# Patient Record
Sex: Female | Born: 1970 | Race: Black or African American | Hispanic: No | Marital: Married | Smoking: Never smoker
Health system: Southern US, Community
[De-identification: ages and names within clinical notes are randomized; demographics above are authoritative.]

## PROBLEM LIST (undated history)

## (undated) DIAGNOSIS — R011 Cardiac murmur, unspecified: Secondary | ICD-10-CM

## (undated) DIAGNOSIS — I8390 Asymptomatic varicose veins of unspecified lower extremity: Secondary | ICD-10-CM

## (undated) DIAGNOSIS — Z8619 Personal history of other infectious and parasitic diseases: Secondary | ICD-10-CM

## (undated) DIAGNOSIS — R768 Other specified abnormal immunological findings in serum: Secondary | ICD-10-CM

## (undated) DIAGNOSIS — E739 Lactose intolerance, unspecified: Secondary | ICD-10-CM

## (undated) DIAGNOSIS — F32A Depression, unspecified: Secondary | ICD-10-CM

## (undated) DIAGNOSIS — K219 Gastro-esophageal reflux disease without esophagitis: Secondary | ICD-10-CM

## (undated) DIAGNOSIS — E559 Vitamin D deficiency, unspecified: Secondary | ICD-10-CM

## (undated) HISTORY — PX: UMBILICAL HERNIA REPAIR: SHX196

## (undated) HISTORY — DX: Personal history of other infectious and parasitic diseases: Z86.19

## (undated) HISTORY — DX: Gastro-esophageal reflux disease without esophagitis: K21.9

## (undated) HISTORY — DX: Depression, unspecified: F32.A

## (undated) HISTORY — DX: Other specified abnormal immunological findings in serum: R76.8

## (undated) HISTORY — DX: Lactose intolerance, unspecified: E73.9

## (undated) HISTORY — DX: Vitamin D deficiency, unspecified: E55.9

## (undated) HISTORY — DX: Asymptomatic varicose veins of unspecified lower extremity: I83.90

## (undated) HISTORY — DX: Cardiac murmur, unspecified: R01.1

---

## 1993-04-09 DIAGNOSIS — Z8619 Personal history of other infectious and parasitic diseases: Secondary | ICD-10-CM

## 1993-04-09 HISTORY — DX: Personal history of other infectious and parasitic diseases: Z86.19

## 2005-01-15 ENCOUNTER — Other Ambulatory Visit: Admission: RE | Admit: 2005-01-15 | Discharge: 2005-01-15 | Payer: Self-pay | Admitting: Family Medicine

## 2006-02-04 ENCOUNTER — Other Ambulatory Visit: Admission: RE | Admit: 2006-02-04 | Discharge: 2006-02-04 | Payer: Self-pay | Admitting: Family Medicine

## 2007-02-13 ENCOUNTER — Other Ambulatory Visit: Admission: RE | Admit: 2007-02-13 | Discharge: 2007-02-13 | Payer: Self-pay | Admitting: Family Medicine

## 2009-01-27 ENCOUNTER — Other Ambulatory Visit: Admission: RE | Admit: 2009-01-27 | Discharge: 2009-01-27 | Payer: Self-pay | Admitting: Family Medicine

## 2009-08-26 ENCOUNTER — Emergency Department (HOSPITAL_COMMUNITY): Admission: EM | Admit: 2009-08-26 | Discharge: 2009-08-26 | Payer: Self-pay | Admitting: Family Medicine

## 2010-02-07 ENCOUNTER — Other Ambulatory Visit: Admission: RE | Admit: 2010-02-07 | Discharge: 2010-02-07 | Payer: Self-pay | Admitting: Family Medicine

## 2010-04-30 ENCOUNTER — Encounter: Payer: Self-pay | Admitting: Family Medicine

## 2010-05-11 ENCOUNTER — Other Ambulatory Visit: Payer: Self-pay | Admitting: Family Medicine

## 2010-05-11 DIAGNOSIS — N6320 Unspecified lump in the left breast, unspecified quadrant: Secondary | ICD-10-CM

## 2010-05-17 ENCOUNTER — Other Ambulatory Visit: Payer: Self-pay

## 2010-05-31 ENCOUNTER — Ambulatory Visit
Admission: RE | Admit: 2010-05-31 | Discharge: 2010-05-31 | Disposition: A | Discharge status: Home / Self Care | Payer: Commercial Managed Care - PPO | Source: Ambulatory Visit | Attending: Family Medicine | Admitting: Family Medicine

## 2010-05-31 DIAGNOSIS — N6320 Unspecified lump in the left breast, unspecified quadrant: Secondary | ICD-10-CM

## 2010-06-05 ENCOUNTER — Emergency Department (HOSPITAL_COMMUNITY)
Admission: EM | Admit: 2010-06-05 | Discharge: 2010-06-05 | Disposition: A | Discharge status: Home / Self Care | Payer: 59 | Attending: Emergency Medicine | Admitting: Emergency Medicine

## 2010-06-05 ENCOUNTER — Emergency Department (HOSPITAL_COMMUNITY): Payer: 59

## 2010-06-05 DIAGNOSIS — S83006A Unspecified dislocation of unspecified patella, initial encounter: Secondary | ICD-10-CM | POA: Insufficient documentation

## 2010-06-05 DIAGNOSIS — X58XXXA Exposure to other specified factors, initial encounter: Secondary | ICD-10-CM | POA: Insufficient documentation

## 2011-05-08 ENCOUNTER — Other Ambulatory Visit: Payer: Self-pay | Admitting: Family Medicine

## 2011-05-08 DIAGNOSIS — N63 Unspecified lump in unspecified breast: Secondary | ICD-10-CM

## 2011-05-08 DIAGNOSIS — Z1231 Encounter for screening mammogram for malignant neoplasm of breast: Secondary | ICD-10-CM

## 2011-05-30 ENCOUNTER — Other Ambulatory Visit: Payer: Self-pay

## 2011-05-30 ENCOUNTER — Other Ambulatory Visit: Payer: Self-pay | Admitting: Family Medicine

## 2011-05-30 ENCOUNTER — Other Ambulatory Visit (HOSPITAL_COMMUNITY)
Admission: RE | Admit: 2011-05-30 | Discharge: 2011-05-30 | Disposition: A | Discharge status: Home / Self Care | Payer: 59 | Source: Ambulatory Visit | Attending: Family Medicine | Admitting: Family Medicine

## 2011-05-30 DIAGNOSIS — Z01419 Encounter for gynecological examination (general) (routine) without abnormal findings: Secondary | ICD-10-CM | POA: Insufficient documentation

## 2011-05-30 DIAGNOSIS — N63 Unspecified lump in unspecified breast: Secondary | ICD-10-CM

## 2011-06-06 ENCOUNTER — Ambulatory Visit
Admission: RE | Admit: 2011-06-06 | Discharge: 2011-06-06 | Disposition: A | Discharge status: Home / Self Care | Payer: 59 | Source: Ambulatory Visit | Attending: Family Medicine | Admitting: Family Medicine

## 2011-06-06 DIAGNOSIS — N63 Unspecified lump in unspecified breast: Secondary | ICD-10-CM

## 2013-02-20 ENCOUNTER — Emergency Department (INDEPENDENT_AMBULATORY_CARE_PROVIDER_SITE_OTHER)
Admission: EM | Admit: 2013-02-20 | Discharge: 2013-02-20 | Disposition: A | Discharge status: Home / Self Care | Payer: 59 | Source: Home / Self Care | Attending: Emergency Medicine | Admitting: Emergency Medicine

## 2013-02-20 ENCOUNTER — Encounter (HOSPITAL_COMMUNITY): Payer: Self-pay | Admitting: Emergency Medicine

## 2013-02-20 DIAGNOSIS — T50905A Adverse effect of unspecified drugs, medicaments and biological substances, initial encounter: Secondary | ICD-10-CM

## 2013-02-20 DIAGNOSIS — T3995XA Adverse effect of unspecified nonopioid analgesic, antipyretic and antirheumatic, initial encounter: Secondary | ICD-10-CM

## 2013-02-20 DIAGNOSIS — T887XXA Unspecified adverse effect of drug or medicament, initial encounter: Secondary | ICD-10-CM

## 2013-02-20 MED ORDER — DIPHENHYDRAMINE HCL 25 MG PO CAPS
ORAL_CAPSULE | ORAL | Status: AC
  Filled 2013-02-20: qty 2

## 2013-02-20 MED ORDER — DIPHENHYDRAMINE HCL 25 MG PO CAPS
50.0000 mg | ORAL_CAPSULE | Freq: Once | ORAL | Status: AC
  Administered 2013-02-20: 50 mg via ORAL

## 2013-02-20 MED ORDER — HYDROXYZINE HCL 25 MG PO TABS: 25.0000 mg | ORAL_TABLET | Freq: Four times a day (QID) | ORAL | Status: DC

## 2013-02-20 NOTE — ED Provider Notes (Addendum)
Chief Complaint:   Chief Complaint  Patient presents with  . Allergic Reaction    History of Present Illness:   Natasha Murphy is a 42 year old female who comes in today, thinking that she has had some type of a reaction to tramadol and the patient has had a one-week history of pain in her neck which radiates along her left trapezius region of her left shoulder. She saw a chiropractor for this twice. He gave her some chiropractic adjustments and prescribed Robaxin. She took this for a couple days but didn't feel any better. Last night she went to the [facility] walk in clinic and she was given a prescription for tramadol. She took this around 8 AM today and shortly thereafter began to experience symptoms of extreme drowsiness, fatigue, dizziness, faintness, nausea, felt like she had difficulty breathing, noted coughing, generalized itching, and racing of her heart. She denies any rash, hives, wheezing, or swelling of her lips, tongue, or throat. She's never had a reaction like this before, but she does not think she is taking tramadol before.  Review of Systems:  Other than noted above, the patient denies any of the following symptoms. Systemic:  No fever, chills, sweats, fatigue, myalgias, headache, or anorexia. Eye:  No redness, pain or drainage. ENT:  No earache, nasal congestion, rhinorrhea, sinus pressure, or sore throat. Lungs:  No cough, sputum production, wheezing, shortness of breath.  Cardiovascular:  No chest pain, palpitations, or syncope. GI:  No nausea, vomiting, abdominal pain or diarrhea. GU:  No dysuria, frequency, or hematuria. Skin:  No rash or pruritis.  PMFSH:  Past medical history, family history, social history, meds, and allergies were reviewed.  She is allergic to penicillin.  Physical Exam:   Vital signs:  BP 125/89  Pulse 99  Temp(Src) 98 F (36.7 C) (Oral)  Resp 18  SpO2 100%  LMP 02/07/2013 Filed Vitals:   02/20/13 1827 02/20/13 1840 Supine  02/20/13  1842 Sitting  02/20/13 1842 Standing   BP: 121/83 135/86 131/82 125/89  Pulse: 103 101 92 99  Temp: 98 F (36.7 C)     TempSrc: Oral     Resp: 18     SpO2: 100%      General:  Alert, the patient is tearful, and appears nervous and anxious. There is no respiratory distress. Eye:  PERRL, full EOMs.  Lids and conjunctivas were normal. ENT:  TMs and canals were normal, without erythema or inflammation.  Nasal mucosa was clear and uncongested, without drainage.  Mucous membranes were moist.  Pharynx was clear, without exudate or drainage.  There were no oral ulcerations or lesions. Neck:  Supple, no adenopathy, tenderness or mass. Thyroid was normal. Lungs:  No respiratory distress.  Lungs were clear to auscultation, without wheezes, rales or rhonchi.  Breath sounds were clear and equal bilaterally. Heart:  Regular rhythm, without gallops, murmers or rubs. Abdomen:  Soft, flat, and non-tender to palpation.  No hepatosplenomagaly or mass. Skin:  Clear, warm, and dry, without rash or lesions.  Course in Urgent Care Center:   She was given Benadryl 50 mg by mouth. Thereafter she felt drowsy, but did not feel any worse and did not have any respiratory distress. She was observed for approximately 45 minutes at the Urgent Care Center, and since she was not having any further episodes of anaphylaxis, urticaria, respiratory distress, was allowed to go home. She was told to return if she should have any further symptoms, or if present symptoms did not  resolve completely within 48 hours.  Assessment:  The encounter diagnosis was Idiosyncratic reaction to medication after proper dose, initial encounter.  The patient was told not to take any further tramadol and this was entered into her chart as an intolerance. There is no evidence of a true allergic reaction to it. I told her for her pharmacy and her primary care physician of this as well, so she would not be prescribed it in the future. She can take  ibuprofen for pain, I think a muscle relaxer is okay for her to take.  Plan:   1.  Meds:  The following meds were prescribed:   Discharge Medication List as of 02/20/2013  7:15 PM    START taking these medications   Details  hydrOXYzine (ATARAX/VISTARIL) 25 MG tablet Take 1 tablet (25 mg total) by mouth every 6 (six) hours., Starting 02/20/2013, Until Discontinued, Normal        2.  Patient Education/Counseling:  The patient was given appropriate handouts, self care instructions, and instructed in symptomatic relief. Encouraged to get rest and extra fluids.  3.  Follow up:  The patient was told to follow up if no better in 3 to 4 days, if becoming worse in any way, and given some red flag symptoms such as rash, hives, wheezing, or increasing difficulty breathing which would prompt immediate return.  Follow up here if no better in 48 hours.         Reuben Likes, MD 02/20/13 2113  Reuben Likes, MD 02/21/13 [phone]

## 2013-02-20 NOTE — ED Notes (Signed)
Pt believes she is having a reaction to tramadol or methocarbamol onset this afternoon Took tramadol today around 0800 and since has been feeling drowsy, near syncope, nauseas, palpitations Denies: CP, wheezing, v/d Alert w/no signs of acute distress.

## 2013-04-16 ENCOUNTER — Other Ambulatory Visit: Payer: Self-pay

## 2013-04-16 DIAGNOSIS — Z1231 Encounter for screening mammogram for malignant neoplasm of breast: Secondary | ICD-10-CM

## 2013-05-07 ENCOUNTER — Other Ambulatory Visit (HOSPITAL_COMMUNITY)
Admission: RE | Admit: 2013-05-07 | Discharge: 2013-05-07 | Disposition: A | Discharge status: Home / Self Care | Payer: 59 | Source: Ambulatory Visit | Attending: Family Medicine | Admitting: Family Medicine

## 2013-05-07 ENCOUNTER — Ambulatory Visit
Admission: RE | Admit: 2013-05-07 | Discharge: 2013-05-07 | Disposition: A | Discharge status: Home / Self Care | Payer: 59 | Source: Ambulatory Visit

## 2013-05-07 ENCOUNTER — Other Ambulatory Visit: Payer: Self-pay | Admitting: Family Medicine

## 2013-05-07 DIAGNOSIS — Z1231 Encounter for screening mammogram for malignant neoplasm of breast: Secondary | ICD-10-CM

## 2013-05-07 DIAGNOSIS — Z0000 Encounter for general adult medical examination without abnormal findings: Secondary | ICD-10-CM | POA: Insufficient documentation

## 2013-05-12 ENCOUNTER — Other Ambulatory Visit: Payer: Self-pay | Admitting: Family Medicine

## 2013-05-12 DIAGNOSIS — R928 Other abnormal and inconclusive findings on diagnostic imaging of breast: Secondary | ICD-10-CM

## 2013-05-21 ENCOUNTER — Ambulatory Visit
Admission: RE | Admit: 2013-05-21 | Discharge: 2013-05-21 | Disposition: A | Discharge status: Home / Self Care | Payer: 59 | Source: Ambulatory Visit | Attending: Family Medicine | Admitting: Family Medicine

## 2013-05-21 DIAGNOSIS — R928 Other abnormal and inconclusive findings on diagnostic imaging of breast: Secondary | ICD-10-CM

## 2014-05-07 ENCOUNTER — Other Ambulatory Visit: Payer: Self-pay

## 2014-05-07 DIAGNOSIS — Z1231 Encounter for screening mammogram for malignant neoplasm of breast: Secondary | ICD-10-CM

## 2014-05-10 ENCOUNTER — Ambulatory Visit
Admission: RE | Admit: 2014-05-10 | Discharge: 2014-05-10 | Disposition: A | Discharge status: Home / Self Care | Payer: 59 | Source: Ambulatory Visit

## 2014-05-10 ENCOUNTER — Other Ambulatory Visit: Payer: Self-pay | Admitting: Family Medicine

## 2014-05-10 ENCOUNTER — Other Ambulatory Visit (HOSPITAL_COMMUNITY)
Admission: RE | Admit: 2014-05-10 | Discharge: 2014-05-10 | Disposition: A | Discharge status: Home / Self Care | Payer: 59 | Source: Ambulatory Visit | Attending: Family Medicine | Admitting: Family Medicine

## 2014-05-10 DIAGNOSIS — Z01411 Encounter for gynecological examination (general) (routine) with abnormal findings: Secondary | ICD-10-CM | POA: Insufficient documentation

## 2014-05-10 DIAGNOSIS — Z1231 Encounter for screening mammogram for malignant neoplasm of breast: Secondary | ICD-10-CM

## 2014-05-11 ENCOUNTER — Other Ambulatory Visit: Payer: Self-pay | Admitting: Family Medicine

## 2014-05-11 DIAGNOSIS — R928 Other abnormal and inconclusive findings on diagnostic imaging of breast: Secondary | ICD-10-CM

## 2014-05-11 LAB — CYTOLOGY - PAP

## 2014-05-24 ENCOUNTER — Other Ambulatory Visit: Payer: 59

## 2014-05-24 ENCOUNTER — Inpatient Hospital Stay: Admission: RE | Admit: 2014-05-24 | Payer: 59 | Source: Ambulatory Visit

## 2014-05-31 ENCOUNTER — Ambulatory Visit
Admission: RE | Admit: 2014-05-31 | Discharge: 2014-05-31 | Disposition: A | Discharge status: Home / Self Care | Payer: 59 | Source: Ambulatory Visit | Attending: Family Medicine | Admitting: Family Medicine

## 2014-05-31 DIAGNOSIS — R928 Other abnormal and inconclusive findings on diagnostic imaging of breast: Secondary | ICD-10-CM

## 2015-07-15 ENCOUNTER — Other Ambulatory Visit: Payer: Self-pay | Admitting: Family Medicine

## 2015-07-15 ENCOUNTER — Other Ambulatory Visit (HOSPITAL_COMMUNITY)
Admission: RE | Admit: 2015-07-15 | Discharge: 2015-07-15 | Disposition: A | Discharge status: Home / Self Care | Payer: 59 | Source: Ambulatory Visit | Attending: Family Medicine | Admitting: Family Medicine

## 2015-07-15 DIAGNOSIS — Z01411 Encounter for gynecological examination (general) (routine) with abnormal findings: Secondary | ICD-10-CM | POA: Diagnosis present

## 2015-07-15 DIAGNOSIS — Z1151 Encounter for screening for human papillomavirus (HPV): Secondary | ICD-10-CM | POA: Insufficient documentation

## 2015-07-19 LAB — CYTOLOGY - PAP

## 2015-10-05 ENCOUNTER — Other Ambulatory Visit: Payer: Self-pay | Admitting: *Deleted

## 2015-10-05 ENCOUNTER — Encounter: Payer: Self-pay | Admitting: Vascular Surgery

## 2015-10-05 DIAGNOSIS — I83891 Varicose veins of right lower extremities with other complications: Secondary | ICD-10-CM

## 2015-12-06 ENCOUNTER — Encounter: Payer: Self-pay | Admitting: Vascular Surgery

## 2015-12-09 ENCOUNTER — Encounter (HOSPITAL_COMMUNITY): Payer: 59

## 2015-12-09 ENCOUNTER — Encounter: Payer: 59 | Admitting: Vascular Surgery

## 2015-12-16 ENCOUNTER — Encounter: Payer: 59 | Admitting: Vascular Surgery

## 2015-12-16 ENCOUNTER — Inpatient Hospital Stay (HOSPITAL_COMMUNITY): Admission: RE | Admit: 2015-12-16 | Payer: 59 | Source: Ambulatory Visit

## 2015-12-29 ENCOUNTER — Other Ambulatory Visit: Payer: Self-pay | Admitting: Family Medicine

## 2015-12-29 DIAGNOSIS — Z1231 Encounter for screening mammogram for malignant neoplasm of breast: Secondary | ICD-10-CM

## 2016-02-06 ENCOUNTER — Ambulatory Visit: Payer: 59

## 2016-02-07 ENCOUNTER — Ambulatory Visit
Admission: RE | Admit: 2016-02-07 | Discharge: 2016-02-07 | Disposition: A | Discharge status: Home / Self Care | Payer: 59 | Source: Ambulatory Visit | Attending: Family Medicine | Admitting: Family Medicine

## 2016-02-07 DIAGNOSIS — Z1231 Encounter for screening mammogram for malignant neoplasm of breast: Secondary | ICD-10-CM

## 2016-05-10 ENCOUNTER — Encounter (HOSPITAL_COMMUNITY): Payer: Self-pay

## 2016-05-10 ENCOUNTER — Emergency Department (HOSPITAL_COMMUNITY)
Admission: EM | Admit: 2016-05-10 | Discharge: 2016-05-11 | Disposition: A | Discharge status: Home / Self Care | Payer: 59 | Source: Home / Self Care | Attending: Emergency Medicine | Admitting: Emergency Medicine

## 2016-05-10 DIAGNOSIS — N9489 Other specified conditions associated with female genital organs and menstrual cycle: Secondary | ICD-10-CM

## 2016-05-10 DIAGNOSIS — Z880 Allergy status to penicillin: Secondary | ICD-10-CM | POA: Diagnosis not present

## 2016-05-10 DIAGNOSIS — N898 Other specified noninflammatory disorders of vagina: Secondary | ICD-10-CM | POA: Diagnosis not present

## 2016-05-10 DIAGNOSIS — Z818 Family history of other mental and behavioral disorders: Secondary | ICD-10-CM | POA: Diagnosis not present

## 2016-05-10 DIAGNOSIS — N938 Other specified abnormal uterine and vaginal bleeding: Secondary | ICD-10-CM | POA: Diagnosis not present

## 2016-05-10 DIAGNOSIS — E559 Vitamin D deficiency, unspecified: Secondary | ICD-10-CM | POA: Insufficient documentation

## 2016-05-10 DIAGNOSIS — Z809 Family history of malignant neoplasm, unspecified: Secondary | ICD-10-CM | POA: Insufficient documentation

## 2016-05-10 DIAGNOSIS — Z888 Allergy status to other drugs, medicaments and biological substances status: Secondary | ICD-10-CM | POA: Diagnosis not present

## 2016-05-10 DIAGNOSIS — Z79899 Other long term (current) drug therapy: Secondary | ICD-10-CM

## 2016-05-10 DIAGNOSIS — Z8249 Family history of ischemic heart disease and other diseases of the circulatory system: Secondary | ICD-10-CM | POA: Diagnosis not present

## 2016-05-10 DIAGNOSIS — N939 Abnormal uterine and vaginal bleeding, unspecified: Secondary | ICD-10-CM | POA: Insufficient documentation

## 2016-05-10 DIAGNOSIS — R102 Pelvic and perineal pain: Secondary | ICD-10-CM | POA: Diagnosis not present

## 2016-05-10 DIAGNOSIS — E739 Lactose intolerance, unspecified: Secondary | ICD-10-CM | POA: Insufficient documentation

## 2016-05-10 DIAGNOSIS — Z9889 Other specified postprocedural states: Secondary | ICD-10-CM | POA: Diagnosis not present

## 2016-05-10 LAB — COMPREHENSIVE METABOLIC PANEL
ALBUMIN: 3.9 g/dL (ref 3.5–5.0)
ALK PHOS: 66 U/L (ref 38–126)
ALT: 13 U/L — AB (ref 14–54)
AST: 17 U/L (ref 15–41)
Anion gap: 8 (ref 5–15)
BUN: 8 mg/dL (ref 6–20)
CALCIUM: 9.3 mg/dL (ref 8.9–10.3)
CHLORIDE: 102 mmol/L (ref 101–111)
CO2: 27 mmol/L (ref 22–32)
CREATININE: 0.83 mg/dL (ref 0.44–1.00)
GFR calc Af Amer: >60 mL/min (ref >60)
GFR calc non Af Amer: >60 mL/min (ref >60)
GLUCOSE: 106 mg/dL — AB (ref 65–99)
Potassium: 3.7 mmol/L (ref 3.5–5.1)
SODIUM: 137 mmol/L (ref 135–145)
Total Bilirubin: 0.3 mg/dL (ref 0.3–1.2)
Total Protein: 7.9 g/dL (ref 6.5–8.1)

## 2016-05-10 LAB — URINALYSIS, ROUTINE W REFLEX MICROSCOPIC
Bilirubin Urine: NEGATIVE
GLUCOSE, UA: NEGATIVE mg/dL
KETONES UR: NEGATIVE mg/dL
NITRITE: NEGATIVE
PH: 5 (ref 5.0–8.0)
Protein, ur: 30 mg/dL — AB
Specific Gravity, Urine: 1.006 (ref 1.005–1.030)

## 2016-05-10 LAB — WET PREP, GENITAL
Clue Cells Wet Prep HPF POC: NONE SEEN
SPERM: NONE SEEN
Trich, Wet Prep: NONE SEEN
Yeast Wet Prep HPF POC: NONE SEEN

## 2016-05-10 LAB — CBC WITH DIFFERENTIAL/PLATELET
BASOS ABS: 0 10*3/uL (ref 0.0–0.1)
Basophils Relative: 0 %
EOS ABS: 0.1 10*3/uL (ref 0.0–0.7)
EOS PCT: 1 %
HCT: 38.5 % (ref 36.0–46.0)
HEMOGLOBIN: 12.8 g/dL (ref 12.0–15.0)
LYMPHS PCT: 34 %
Lymphs Abs: 4.1 10*3/uL — ABNORMAL HIGH (ref 0.7–4.0)
MCH: 30 pg (ref 26.0–34.0)
MCHC: 33.2 g/dL (ref 30.0–36.0)
MCV: 90.2 fL (ref 78.0–100.0)
MONO ABS: 0.4 10*3/uL (ref 0.1–1.0)
MONOS PCT: 3 %
NEUTROS ABS: 7.4 10*3/uL (ref 1.7–7.7)
Neutrophils Relative %: 62 %
Platelets: 249 10*3/uL (ref 150–400)
RBC: 4.27 MIL/uL (ref 3.87–5.11)
RDW: 13.9 % (ref 11.5–15.5)
WBC: 12 10*3/uL — ABNORMAL HIGH (ref 4.0–10.5)

## 2016-05-10 LAB — HCG, QUANTITATIVE, PREGNANCY

## 2016-05-10 NOTE — ED Provider Notes (Signed)
[facility]-EMERGENCY DEPT Provider Note   CSN: [id number] Arrival date & time: 05/10/16  1953     History   Chief Complaint Chief Complaint  Patient presents with  . Vaginal Bleeding    HPI Natasha Berlinachosa M Mikita is a 46 y.o. female.  HPI     Natasha Murphy is a 46 y.o. female, with a history of Chlamydia and gonorrhea, presenting to the ED with heavy menstrual cycles. Pt states she had a heavy period beginning Apr 09, 2016 that lasted for two weeks. Endorses lightheadedness and fatigue at the end of this 2 weeks. For a year prior to this, she began to have changes in the frequency of her periods and began to intermittently have 2 periods a month. Has had to change a heavy pad every 2 hours.  Denies abnormal vaginal discharge, fever/chills, urinary complaints, abdominal pain, N/V, or any other complaints.  Last pap smear and annual exam was September 2017. Pt has plans to follow up with [facility] on Tuesday next week.   Past Medical History:  Diagnosis Date  . HSV-2 seropositive   . Hx of chlamydia infection 1995, 01/2009  . Hx of gonorrhea 1995  . Lactose intolerance   . Varicose veins   . Vitamin D deficiency     There are no active problems to display for this patient.   Past Surgical History:  Procedure Laterality Date  . CESAREAN SECTION WITH BILATERAL TUBAL LIGATION    . UMBILICAL HERNIA REPAIR  age 89    OB History    No data available       Home Medications    Prior to Admission medications   Medication Sig Start Date End Date Taking? Authorizing Provider  Ascorbic Acid (VITAMIN C ER PO) Take by mouth daily.    Historical Provider, MD  cetirizine (ZYRTEC ALLERGY) 10 MG tablet Take 10 mg by mouth daily.    Historical Provider, MD  Cyanocobalamin (VITAMIN B-12 CR PO) Take by mouth.    Historical Provider, MD  hydrOXYzine (ATARAX/VISTARIL) 25 MG tablet Take 1 tablet (25 mg total) by mouth every 6 (six) hours. 02/20/13   Reuben Likesavid C Keller, MD  Multiple Vitamin  (MULTIVITAMIN) tablet Take 1 tablet by mouth daily.    Historical Provider, MD    Family History Family History  Problem Relation Age of Onset  . Hypertension Mother   . Depression Mother   . Cancer Paternal Aunt   . Cancer Paternal Grandmother     Social History Social History  Substance Use Topics  . Smoking status: Never Smoker  . Smokeless tobacco: Not on file  . Alcohol use Yes     Comment: occassional (twice/month)     Allergies   Tramadol and Penicillins   Review of Systems Review of Systems  Constitutional: Negative for chills and fever.  Respiratory: Negative for shortness of breath.   Gastrointestinal: Negative for abdominal pain, diarrhea, nausea and vomiting.  Genitourinary: Positive for menstrual problem. Negative for dysuria, pelvic pain, vaginal discharge and vaginal pain.  Skin: Negative for pallor.  Neurological: Negative for dizziness, weakness, light-headedness and headaches.  All other systems reviewed and are negative.    Physical Exam Updated Vital Signs BP 119/92 (BP Location: Right Arm)   Pulse 84   Temp 98.1 F (36.7 C) (Oral)   Resp 18   Ht 5\' 7"  (1.702 m)   Wt 90.7 kg   LMP 05/10/2016 (Exact Date)   SpO2 99%   BMI 31.32 kg/m  Physical Exam  Constitutional: She appears well-developed and well-nourished. No distress.  HENT:  Head: Normocephalic and atraumatic.  Mouth/Throat: Oropharynx is clear and moist. Mucous membranes are not pale and not dry.  Eyes: Conjunctivae are normal.  Neck: Neck supple.  Cardiovascular: Normal rate, regular rhythm, normal heart sounds and intact distal pulses.   Pulmonary/Chest: Effort normal and breath sounds normal. No respiratory distress.  Abdominal: Soft. There is no tenderness. There is no guarding.  Genitourinary:  Genitourinary Comments: External genitalia normal Vagina with discharge - Pooled, bright red blood in vaginal vault. Appears to be originating from the cervical os. One or two dime  size or smaller clots. Cervix  normal negative for cervical motion tenderness Adnexa palpated, no masses, negative for tenderness noted Bladder palpated negative for tenderness Uterus palpated no masses, negative for tenderness  Otherwise normal female genitalia. Med Tech served as chaperone during exam.  Musculoskeletal: She exhibits no edema.  Lymphadenopathy:    She has no cervical adenopathy.  Neurological: She is alert.  Skin: Skin is warm and dry. She is not diaphoretic.  Psychiatric: She has a normal mood and affect. Her behavior is normal.  Nursing note and vitals reviewed.    ED Treatments / Results  Labs (all labs ordered are listed, but only abnormal results are displayed) Labs Reviewed  WET PREP, GENITAL - Abnormal; Notable for the following:       Result Value   WBC, Wet Prep HPF POC FEW (*)    All other components within normal limits  URINALYSIS, ROUTINE W REFLEX MICROSCOPIC - Abnormal; Notable for the following:    APPearance HAZY (*)    Hgb urine dipstick LARGE (*)    Protein, ur 30 (*)    Leukocytes, UA TRACE (*)    Bacteria, UA FEW (*)    Squamous Epithelial / LPF 0-5 (*)    All other components within normal limits  CBC WITH DIFFERENTIAL/PLATELET - Abnormal; Notable for the following:    WBC 12.0 (*)    Lymphs Abs 4.1 (*)    All other components within normal limits  COMPREHENSIVE METABOLIC PANEL - Abnormal; Notable for the following:    Glucose, Bld 106 (*)    ALT 13 (*)    All other components within normal limits  HCG, QUANTITATIVE, PREGNANCY  GC/CHLAMYDIA PROBE AMP ([place]) NOT AT [facility]    EKG  EKG Interpretation None       Radiology No results found.  Procedures Procedures (including critical care time)  Medications Ordered in ED Medications - No data to display   Initial Impression / Assessment and Plan / ED Course  I have reviewed the triage vital signs and the nursing notes.  Pertinent labs & imaging results that were  available during my care of the patient were reviewed by me and considered in my medical decision making (see chart for details).      Patient presents with menstrual abnormality and abnormal vaginal bleeding. Labs and vitals are reassuring. Patient has close follow-up already set up with OB/GYN. Return precautions discussed. Patient voices understanding of all instructions and is comfortable with discharge.     Vitals:   05/10/16 2009 05/10/16 2010 05/11/16 0016 05/11/16 0017  BP: 119/92  119/91   Pulse: 84   80  Resp: 18   18  Temp: 98.1 F (36.7 C)     TempSrc: Oral     SpO2: 99%   100%  Weight:  90.7 kg    Height:  5\' 7"  (1.702 m)       Final Clinical Impressions(s) / ED Diagnoses   Final diagnoses:  Abnormal uterine bleeding    New Prescriptions Discharge Medication List as of 05/10/2016 11:58 PM       Anselm Pancoast, PA-C 05/11/16 0114    Tilden Fossa, MD 05/12/16 [phone]

## 2016-05-10 NOTE — ED Notes (Signed)
Pt states she normally has a heavy period for one month and then skips a month. She states she just had a period last month that was heavy and now she is bleeding heavily again.

## 2016-05-10 NOTE — ED Triage Notes (Signed)
Pt endorses heavy vaginal bleeding that began last night with dizziness. Pt states that she had heavy period in December for 2 weeks. VSS.

## 2016-05-10 NOTE — Discharge Instructions (Signed)
You have been seen today for vaginal bleeding. There were no significant abnormalities on your lab results or vital signs today. Please follow up with OBGYN as soon as possible, as planned. Should symptoms worsen or other related concerns arise, please proceed directly to the emergency room at [facility].

## 2016-05-11 ENCOUNTER — Encounter (HOSPITAL_COMMUNITY): Payer: Self-pay

## 2016-05-11 ENCOUNTER — Inpatient Hospital Stay (HOSPITAL_COMMUNITY)
Admission: AD | Admit: 2016-05-11 | Discharge: 2016-05-11 | Disposition: A | Discharge status: Home / Self Care | Payer: 59 | Source: Ambulatory Visit | Attending: Obstetrics and Gynecology | Admitting: Obstetrics and Gynecology

## 2016-05-11 DIAGNOSIS — N938 Other specified abnormal uterine and vaginal bleeding: Secondary | ICD-10-CM

## 2016-05-11 LAB — CBC
HEMATOCRIT: 34.3 % — AB (ref 36.0–46.0)
Hemoglobin: 11.8 g/dL — ABNORMAL LOW (ref 12.0–15.0)
MCH: 30.7 pg (ref 26.0–34.0)
MCHC: 34.4 g/dL (ref 30.0–36.0)
MCV: 89.3 fL (ref 78.0–100.0)
Platelets: 234 10*3/uL (ref 150–400)
RBC: 3.84 MIL/uL — ABNORMAL LOW (ref 3.87–5.11)
RDW: 14 % (ref 11.5–15.5)
WBC: 11.3 10*3/uL — ABNORMAL HIGH (ref 4.0–10.5)

## 2016-05-11 LAB — URINALYSIS, ROUTINE W REFLEX MICROSCOPIC
Bacteria, UA: NONE SEEN
Bilirubin Urine: NEGATIVE
GLUCOSE, UA: NEGATIVE mg/dL
Ketones, ur: NEGATIVE mg/dL
Leukocytes, UA: NEGATIVE
NITRITE: NEGATIVE
Protein, ur: NEGATIVE mg/dL
SPECIFIC GRAVITY, URINE: 1.026 (ref 1.005–1.030)
pH: 6 (ref 5.0–8.0)

## 2016-05-11 LAB — GC/CHLAMYDIA PROBE AMP (~~LOC~~) NOT AT ARMC
Chlamydia: NEGATIVE
Neisseria Gonorrhea: NEGATIVE

## 2016-05-11 LAB — TSH: TSH: 1.113 u[IU]/mL (ref 0.350–4.500)

## 2016-05-11 MED ORDER — MEDROXYPROGESTERONE ACETATE 10 MG PO TABS: 10.0000 mg | ORAL_TABLET | Freq: Four times a day (QID) | ORAL | 2 refills | Status: DC

## 2016-05-11 NOTE — MAU Note (Addendum)
Was seen at [facility] last night and told everything came back normal. I need u/s or MRI. Having heavy bleeding to point had to change clothes today. Using tampon and pad every 2 hours with clots. First of January period lasted 2 wks with clots and now bleeding started again and having cramps. Was told have have fibroids

## 2016-05-11 NOTE — MAU Provider Note (Signed)
Chief Complaint:  Vaginal Bleeding   First Provider Initiated Contact with Patient 05/11/16 2126      HPI: Natasha Murphy is a 46 y.o. [id number] who presents to maternity admissions reporting Heavy bleeding with second menses in a month.  Has a history or heavy periods, but in the last few months, alternates between heavy and light.  Then had only 2 weeks between these last two periods.  States is passing clots and soaking a pad every hour. Was seen in ED yesterday.  Hemoglobin was 12.8.  STD testing and pelvic exam were done.    She had told them she had an appt next week so they discharged her.  Then she called and they cannot see her until March. Sees Dr Dion BodyVarnado, last about a year or so ago.  No dizziness or syncope. She reports vaginal bleeding, but no vaginal itching/burning, urinary symptoms, h/a, dizziness, n/v, or fever/chills.    Vaginal Bleeding  The patient's primary symptoms include pelvic pain (cramping) and vaginal bleeding. The patient's pertinent negatives include no genital itching, genital lesions, genital odor or genital rash. This is a recurrent problem. The current episode started 1 to 4 weeks ago. The problem occurs constantly. The problem has been unchanged. The pain is mild. The problem affects both sides. She is not pregnant. Associated symptoms include abdominal pain. Pertinent negatives include no back pain, chills, constipation, diarrhea, fever, frequency, headaches, nausea or vomiting. The vaginal discharge was bloody. The vaginal bleeding is heavier than menses. She has been passing clots. She has not been passing tissue. Nothing aggravates the symptoms. She has tried nothing for the symptoms. She uses nothing for contraception.   RN Note: Was seen at [facility] last night and told everything came back normal. I need u/s or MRI. Having heavy bleeding to point had to change clothes today. Using tampon and pad every 2 hours with clots. First of January period lasted 2 wks with  clots and now bleeding started again and having cramps. Was told have have fibroids  Past Medical History: Past Medical History:  Diagnosis Date  . HSV-2 seropositive   . Hx of chlamydia infection 1995, 01/2009  . Hx of gonorrhea 1995  . Lactose intolerance   . Medical history non-contributory   . Varicose veins   . Vitamin D deficiency     Past obstetric history: OB History  Gravida Para Term Preterm AB Living  4 1 1   1     SAB TAB Ectopic Multiple Live Births    1     3    # Outcome Date GA Lbr Len/2nd Weight Sex Delivery Anes PTL Lv  4 Gravida 1998          3 Term 1996          2 Gravida 1995          1 TAB               Past Surgical History: Past Surgical History:  Procedure Laterality Date  . CESAREAN SECTION WITH BILATERAL TUBAL LIGATION    . UMBILICAL HERNIA REPAIR  age 89    Family History: Family History  Problem Relation Age of Onset  . Hypertension Mother   . Depression Mother   . Cancer Paternal Aunt   . Cancer Paternal Grandmother     Social History: Social History  Substance Use Topics  . Smoking status: Never Smoker  . Smokeless tobacco: Not on file  . Alcohol use Yes  Comment: occassional (twice/month)    Allergies:  Allergies  Allergen Reactions  . Tramadol     Patient became nauseated, drowsy, faint, dizzy, felt difficulty breathing, heart raced, and had cough and itching after a single dose of 50 mg of medication.  Marland Kitchen Penicillins Hives    Has patient had a PCN reaction causing immediate rash, facial/tongue/throat swelling, SOB or lightheadedness with hypotension: no Has patient had a PCN reaction causing severe rash involving mucus membranes or skin necrosis: no Has patient had a PCN reaction that required hospitalization no Has patient had a PCN reaction occurring within the last 10 years: 2008-2009 If all of the above answers are "NO", then may proceed with Cephalosporin use.     Meds:  Prescriptions Prior to Admission   Medication Sig Dispense Refill Last Dose  . acetaminophen (TYLENOL) 500 MG tablet Take 500-1,000 mg by mouth every 6 (six) hours as needed for moderate pain.   Past Week at Unknown time  . Alum & Mag Hydroxide-Simeth (ANTACID ANTI-GAS PO) Take 1 tablet by mouth daily as needed (heartburn).   Past Week at Unknown time  . cetirizine (ZYRTEC ALLERGY) 10 MG tablet Take 10 mg by mouth daily as needed for allergies.    early fall at Unknown time  . Cholecalciferol (VITAMIN D) 2000 units CAPS Take 1 capsule by mouth daily.   Past Month at Unknown time  . Cyanocobalamin (VITAMIN B-12 CR PO) Take 1 tablet by mouth daily.    Past Month at Unknown time  . hydrOXYzine (ATARAX/VISTARIL) 25 MG tablet Take 1 tablet (25 mg total) by mouth every 6 (six) hours. (Patient not taking: Reported on 05/11/2016) 12 tablet 0 Not Taking at Unknown time    I have reviewed patient's Past Medical Hx, Surgical Hx, Family Hx, Social Hx, medications and allergies.  ROS:  Review of Systems  Constitutional: Negative for chills and fever.  Gastrointestinal: Positive for abdominal pain. Negative for constipation, diarrhea, nausea and vomiting.  Genitourinary: Positive for pelvic pain (cramping) and vaginal bleeding. Negative for frequency.  Musculoskeletal: Negative for back pain.  Neurological: Negative for headaches.   Other systems negative     Physical Exam  Patient Vitals for the past 24 hrs:  BP Temp Pulse Resp Height Weight  05/11/16 1945 136/81 98.4 F (36.9 C) 80 18 5\' 7"  (1.702 m) 206 lb 1.9 oz (93.5 kg)   Constitutional: Well-developed, well-nourished female in no acute distress.  Cardiovascular: normal rate and rhythm, no ectopy audible, S1 & S2 heard, no murmur Respiratory: normal effort, no distress. Lungs CTAB with no wheezes or crackles GI: Abd soft, non-tender.  Nondistended.  No rebound, No guarding.  Bowel Sounds audible  MS: Extremities nontender, no edema, normal ROM Neurologic: Alert and oriented  x 4.   Grossly nonfocal. GU: Neg CVAT. Skin:  Warm and Dry Psych:  Affect appropriate.  PELVIC EXAM: Cervix pink, visually closed, without lesion, small bloody discharge, vaginal walls and external genitalia normal Bimanual exam: Cervix firm, anterior, neg CMT, uterus nontender, Difficult to palpate but no overt enlargement, adnexa without tenderness, enlargement, or mass  No blood on current pad which has been on for about 20 minutes.   Labs:    Results for orders placed or performed during the hospital encounter of 05/11/16 (from the past 72 hour(s))  Urinalysis, Routine w reflex microscopic     Status: Abnormal   Collection Time: 05/11/16  7:55 PM  Result Value Ref Range   Color, Urine YELLOW YELLOW  APPearance CLEAR CLEAR   Specific Gravity, Urine 1.026 1.005 - 1.030   pH 6.0 5.0 - 8.0   Glucose, UA NEGATIVE NEGATIVE mg/dL   Hgb urine dipstick LARGE (A) NEGATIVE   Bilirubin Urine NEGATIVE NEGATIVE   Ketones, ur NEGATIVE NEGATIVE mg/dL   Protein, ur NEGATIVE NEGATIVE mg/dL   Nitrite NEGATIVE NEGATIVE   Leukocytes, UA NEGATIVE NEGATIVE   RBC / HPF 0-5 0 - 5 RBC/hpf   WBC, UA 0-5 0 - 5 WBC/hpf   Bacteria, UA NONE SEEN NONE SEEN   Squamous Epithelial / LPF 0-5 (A) NONE SEEN   Mucous PRESENT   CBC     Status: Abnormal   Collection Time: 05/11/16  9:37 PM  Result Value Ref Range   WBC 11.3 (H) 4.0 - 10.5 K/uL   RBC 3.84 (L) 3.87 - 5.11 MIL/uL   Hemoglobin 11.8 (L) 12.0 - 15.0 g/dL   HCT 40.9 (L) 81.1 - 91.4 %   MCV 89.3 78.0 - 100.0 fL   MCH 30.7 26.0 - 34.0 pg   MCHC 34.4 30.0 - 36.0 g/dL   RDW 78.2 95.6 - 21.3 %   Platelets 234 150 - 400 K/uL     Imaging:  No results found.  MAU Course/MDM: I have ordered labs as follows:  CBC, TSH, UA Imaging ordered: none Results reviewed.   Consult Dr Su Hilt. She agrees with rechecking a CBC and adding TSH (so Dr Dion Body will have result when she sees her).  She recommends Starting Provera 10mg  q6hrs for 24 hrs and then 1-2  times per day until her visit in office..    Pt stable at time of discharge.  Assessment: Dysfunctional Uterine Bleeding  Plan: Discharge home Recommend Keep appt with Dr Dion Body next month, advised to call her office Monday to let them know we started her on Provera. Rx sent for Provera 10mg  q6 hrs x 24 hrs then 1-2 per day for Dysfunctional Uterine Bleeding  Encouraged to return here or to other Urgent Care/ED if she develops worsening of symptoms, increase in pain, fever, or other concerning symptoms.   Wynelle Bourgeois CNM, MSN Certified Nurse-Midwife 05/11/2016 9:27 PM

## 2016-05-11 NOTE — Discharge Instructions (Signed)
Menorrhagia Menorrhagia is when your menstrual periods are heavy or last longer than usual. Follow these instructions at home:  Only take medicine as told by your doctor.  Take any iron pills as told by your doctor. Heavy bleeding may cause low levels of iron in your body.  Do not take aspirin 1 week before or during your period. Aspirin can make the bleeding worse.  Lie down for a while if you change your tampon or pad more than once in 2 hours. This may help lessen the bleeding.  Eat a healthy diet and foods with iron. These foods include leafy green vegetables, meat, liver, eggs, and whole grain breads and cereals.  Do not try to lose weight. Wait until the heavy bleeding has stopped and your iron level is normal. Contact a doctor if:  You soak through a pad or tampon every 1 or 2 hours, and this happens every time you have a period.  You need to use pads and tampons at the same time because you are bleeding so much.  You need to change your pad or tampon during the night.  You have a period that lasts for more than 8 days.  You pass clots bigger than 1 inch (2.5 cm) wide.  You have irregular periods that happen more or less often than once a month.  You feel dizzy or pass out (faint).  You feel very weak or tired.  You feel short of breath or feel your heart is beating too fast when you exercise.  You feel sick to your stomach (nausea) and you throw up (vomit) while you are taking your medicine.  You have watery poop (diarrhea) while you are taking your medicine.  You have any problems that may be related to the medicine you are taking. Get help right away if:  You soak through 4 or more pads or tampons in 2 hours.  You have any bleeding while you are pregnant. This information is not intended to replace advice given to you by your health care provider. Make sure you discuss any questions you have with your health care provider. Document Released: 01/03/2008 Document  Revised: 09/01/2015 Document Reviewed: 09/25/2012 Elsevier Interactive Patient Education  2017 Elsevier Inc. Perimenopause Perimenopause is the time when your body begins to move into the menopause (no menstrual period for 12 straight months). It is a natural process. Perimenopause can begin 2-8 years before the menopause and usually lasts for 1 year after the menopause. During this time, your ovaries may or may not produce an egg. The ovaries vary in their production of estrogen and progesterone hormones each month. This can cause irregular menstrual periods, difficulty getting pregnant, vaginal bleeding between periods, and uncomfortable symptoms. CAUSES  Irregular production of the ovarian hormones, estrogen and progesterone, and not ovulating every month.  Other causes include:  Tumor of the pituitary gland in the brain.  Medical disease that affects the ovaries.  Radiation treatment.  Chemotherapy.  Unknown causes.  Heavy smoking and excessive alcohol intake can bring on perimenopause sooner. SIGNS AND SYMPTOMS   Hot flashes.  Night sweats.  Irregular menstrual periods.  Decreased sex drive.  Vaginal dryness.  Headaches.  Mood swings.  Depression.  Memory problems.  Irritability.  Tiredness.  Weight gain.  Trouble getting pregnant.  The beginning of losing bone cells (osteoporosis).  The beginning of hardening of the arteries (atherosclerosis). DIAGNOSIS  Your health care provider will make a diagnosis by analyzing your age, menstrual history, and symptoms. He  or she will do a physical exam and note any changes in your body, especially your female organs. Female hormone tests may or may not be helpful depending on the amount of female hormones you produce and when you produce them. However, other hormone tests may be helpful to rule out other problems. TREATMENT  In some cases, no treatment is needed. The decision on whether treatment is necessary during  the perimenopause should be made by you and your health care provider based on how the symptoms are affecting you and your lifestyle. Various treatments are available, such as:  Treating individual symptoms with a specific medicine for that symptom.  Herbal medicines that can help specific symptoms.  Counseling.  Group therapy. HOME CARE INSTRUCTIONS   Keep track of your menstrual periods (when they occur, how heavy they are, how long between periods, and how long they last) as well as your symptoms and when they started.  Only take over-the-counter or prescription medicines as directed by your health care provider.  Sleep and rest.  Exercise.  Eat a diet that contains calcium (good for your bones) and soy (acts like the estrogen hormone).  Do not smoke.  Avoid alcoholic beverages.  Take vitamin supplements as recommended by your health care provider. Taking vitamin E may help in certain cases.  Take calcium and vitamin D supplements to help prevent bone loss.  Group therapy is sometimes helpful.  Acupuncture may help in some cases. SEEK MEDICAL CARE IF:   You have questions about any symptoms you are having.  You need a referral to a specialist (gynecologist, psychiatrist, or psychologist). SEEK IMMEDIATE MEDICAL CARE IF:   You have vaginal bleeding.  Your period lasts longer than 8 days.  Your periods are recurring sooner than 21 days.  You have bleeding after intercourse.  You have severe depression.  You have pain when you urinate.  You have severe headaches.  You have vision problems. This information is not intended to replace advice given to you by your health care provider. Make sure you discuss any questions you have with your health care provider. Document Released: 05/03/2004 Document Revised: 04/16/2014 Document Reviewed: 10/23/2012 Elsevier Interactive Patient Education  2017 [employer]

## 2016-09-11 ENCOUNTER — Encounter: Payer: 59 | Admitting: Family Medicine

## 2016-09-11 ENCOUNTER — Ambulatory Visit (INDEPENDENT_AMBULATORY_CARE_PROVIDER_SITE_OTHER): Payer: 59

## 2016-09-11 ENCOUNTER — Ambulatory Visit (INDEPENDENT_AMBULATORY_CARE_PROVIDER_SITE_OTHER): Payer: 59 | Admitting: Sports Medicine

## 2016-09-11 ENCOUNTER — Encounter: Payer: Self-pay | Admitting: Family Medicine

## 2016-09-11 ENCOUNTER — Encounter: Payer: Self-pay | Admitting: Sports Medicine

## 2016-09-11 VITALS — BP 116/82 | HR 89 | Ht 67.0 in | Wt 201.8 lb

## 2016-09-11 DIAGNOSIS — M25562 Pain in left knee: Secondary | ICD-10-CM | POA: Diagnosis not present

## 2016-09-11 DIAGNOSIS — M25561 Pain in right knee: Secondary | ICD-10-CM | POA: Diagnosis not present

## 2016-09-11 NOTE — Patient Instructions (Signed)
Please perform the exercise program that Natasha Murphy has prepared for you and gone over in detail on a daily basis.  In addition to the handout you were provided you can access your program through: [email or web address]   Your unique program code is: [email or web address]

## 2016-09-11 NOTE — Progress Notes (Signed)
Patient was roomed but had to leave for another appointment prior to her visit. No charge. She rescheduled.    Natasha Murphy is a 46 y.o. female [facility]   Patient Care Team: Helane RimaWallace, Arabella Revelle, DO as PCP - General (Family Medicine)   History of Present Illness:   Natasha Murphy, CMA, acting as scribe for Dr. Earlene PlaterWallace.  CC:  Patient comes in today to establish care.  She has a varicose vein on her right calf.  Occasionally painful.  This has been present for approximately 20 years.  She would like a referral to vascular surgery.  Also has intermittent ankle swelling.  She is unsure what triggers the swelling.  Medications and allergies have been reviewed.    HPI  Health Maintenance Due  Topic Date Due  . HIV Screening  12/21/1985  . TETANUS/TDAP  12/21/1989     There is no immunization history on file for this patient.  PMHx, SurgHx, SocialHx, Medications, and Allergies were reviewed in the Visit Navigator and updated as appropriate.   Past Medical History:  Diagnosis Date  . HSV-2 seropositive   . Hx of chlamydia infection 1995, 01/2009  . Hx of gonorrhea 1995  . Lactose intolerance   . Medical history non-contributory   . Varicose veins   . Vitamin D deficiency     Past Surgical History:  Procedure Laterality Date  . CESAREAN SECTION WITH BILATERAL TUBAL LIGATION    . UMBILICAL HERNIA REPAIR  age 89    Family History  Problem Relation Age of Onset  . Hypertension Mother   . Depression Mother   . Cancer Paternal Aunt   . Cancer Paternal Grandmother    Social History  Substance Use Topics  . Smoking status: Never Smoker  . Smokeless tobacco: Never Used  . Alcohol use Yes     Comment: occassional (twice/month)    Current Medications and Allergies:   Current Outpatient Prescriptions:  .  acetaminophen (TYLENOL) 500 MG tablet, Take 500-1,000 mg by mouth every 6 (six) hours as needed for moderate pain., Disp: , Rfl:  .  Alum & Mag  Hydroxide-Simeth (ANTACID ANTI-GAS PO), Take 1 tablet by mouth daily as needed (heartburn)., Disp: , Rfl:  .  cetirizine (ZYRTEC ALLERGY) 10 MG tablet, Take 10 mg by mouth daily as needed for allergies. , Disp: , Rfl:  .  Cholecalciferol (VITAMIN D) 2000 units CAPS, Take 1 capsule by mouth daily., Disp: , Rfl:  .  Cyanocobalamin (VITAMIN B-12 CR PO), Take 1 tablet by mouth daily. , Disp: , Rfl:   Allergies  Allergen Reactions  . Tramadol     Patient became nauseated, drowsy, faint, dizzy, felt difficulty breathing, heart raced, and had cough and itching after a single dose of 50 mg of medication.  Marland Kitchen. Penicillins Hives    Has patient had a PCN reaction causing immediate rash, facial/tongue/throat swelling, SOB or lightheadedness with hypotension: no Has patient had a PCN reaction causing severe rash involving mucus membranes or skin necrosis: no Has patient had a PCN reaction that required hospitalization no Has patient had a PCN reaction occurring within the last 10 years: 2008-2009 If all of the above answers are "NO", then may proceed with Cephalosporin use.    Review of Systems:   Review of Systems  Constitutional: Negative for chills and fever.  HENT: Negative for congestion, ear pain and sore throat.   Eyes: Negative for blurred vision and pain.  Respiratory: Negative  for cough and shortness of breath.   Cardiovascular: Positive for leg swelling (Ankle swelling). Negative for chest pain and palpitations.  Gastrointestinal: Negative for abdominal pain, nausea and vomiting.  Genitourinary: Negative for frequency.  Musculoskeletal: Positive for joint pain.       Knee pain  Skin: Negative for rash.  Neurological: Negative for dizziness, loss of consciousness, weakness and headaches.  Endo/Heme/Allergies: Does not bruise/bleed easily.  Psychiatric/Behavioral: Negative for depression. The patient is not nervous/anxious.     Vitals:   Vitals:   09/11/16 1454  BP: 116/82  Pulse:  89  Temp: 98.4 F (36.9 C)  TempSrc: Oral  Weight: 201 lb (91.2 kg)  Height: 5\' 7"  (1.702 m)     Body mass index is 31.48 kg/m.  Physical Exam:   Physical Exam    Assessment and Plan:

## 2016-09-11 NOTE — Progress Notes (Signed)
OFFICE VISIT NOTE [name]. Delorise [facility] at [facility] [phone]  MERIT GADSBY - 46 y.o. female MRN [id number]  Date of birth: [birth year]  Visit Date: 09/11/2016  PCP: Helane Rima, DO   Referred by: No ref. provider found  Orlie Dakin, CMA acting as scribe for Dr. Berline Chough.  SUBJECTIVE:   Chief Complaint  Patient presents with  . tingling sensation in both knees   HPI: As below and per problem based documentation when appropriate.  Pt presents today with complaint of tingling sensation in both knees.  Pt c/o intermittent tingling in both knees.  Pt reports that left knee cap popped out of place about 6 years ago  The pain is described as tingling sensation with some weakness when bearing weight. She reports no problems with extending or bending. Improves with resting legs.  Therapies tried include : Pt did PT at [facility] 6 years ago.   Other associated symptoms include: Pt reports some swelling in her ankles. They tend to swell mostly when she travels but also has occasional swelling.   Pt denies fever, chills but does c/o night sweats.     Review of Systems  Constitutional: Negative for chills and fever.  Respiratory: Negative for shortness of breath and wheezing.   Cardiovascular: Positive for leg swelling. Negative for chest pain and palpitations.  Musculoskeletal: Negative for falls.  Neurological: Positive for tingling. Negative for dizziness and headaches.  Endo/Heme/Allergies: Does not bruise/bleed easily.    Otherwise per HPI.  HISTORY & PERTINENT PRIOR DATA:  No specialty comments available. She reports that she has never smoked. She has never used smokeless tobacco. No results for input(s): HGBA1C, LABURIC in the last 8760 hours. Medications & Allergies reviewed per EMR Patient Active Problem List   Diagnosis Date Noted  . Pain in both knees 09/12/2016   Past Medical History:  Diagnosis Date   . HSV-2 seropositive   . Hx of chlamydia infection 1995, 01/2009  . Hx of gonorrhea 1995  . Lactose intolerance   . Medical history non-contributory   . Varicose veins   . Vitamin D deficiency    Family History  Problem Relation Age of Onset  . Hypertension Mother   . Depression Mother   . Cancer Paternal Aunt   . Cancer Paternal Grandmother    Past Surgical History:  Procedure Laterality Date  . CESAREAN SECTION WITH BILATERAL TUBAL LIGATION    . UMBILICAL HERNIA REPAIR  age 20   Social History   Occupational History  . Not on file.   Social History Main Topics  . Smoking status: Never Smoker  . Smokeless tobacco: Never Used  . Alcohol use Yes     Comment: occassional (twice/month)  . Drug use: No  . Sexual activity: Yes    OBJECTIVE:  VS:  HT:5\' 7"  (170.2 cm)   WT:201 lb 12.8 oz (91.5 kg)  BMI:31.7    BP:116/82  HR:89bpm  TEMP: ( )  RESP:97 % EXAM: Findings:  WDWN, NAD, Non-toxic appearing Alert & appropriately interactive Not depressed or anxious appearing No increased work of breathing. Pupils are equal. EOM intact without nystagmus No clubbing or cyanosis of the extremities appreciated No significant rashes/lesions/ulcerations overlying the examined area. DP & PT pulses 2+/4.  No significant pretibial edema. Sensation intact to light touch in lower extremities.  Bilateral Knee: Overall joint is well aligned, no significant deformity.  She does have a slight tibial extorsion with slight  lateral tracking of the patella. No significant effusion.   ROM: 0 to 120.   Extensor mechanism intact No significant medial or lateral joint line tenderness.  She does have a small amount of pain over the medial patellar facet bilaterally.  This is symmetric.  Small amount of  grinding with patellar grind.  No pain with this. Stable to varus/valgus strain & anterior/posterior drawer.  Normal Lachman's.   Negative McMurray's and Thessaly.        Dg Knee 1-2 Views  Left  Result Date: 09/11/2016 CLINICAL DATA:  Bilateral knee pain.  No known left knee injury. EXAM: LEFT KNEE - 1-2 VIEW COMPARISON:  Right knee obtained at the same time. FINDINGS: No evidence of fracture, dislocation, or joint effusion. No evidence of arthropathy or other focal bone abnormality. Soft tissues are unremarkable. IMPRESSION: Normal examination. Electronically Signed   By: Beckie SaltsSteven  Reid M.D.   On: 09/11/2016 15:21   Dg Knee Ap/lat W/sunrise Right  Result Date: 09/11/2016 CLINICAL DATA:  Bilateral knee pain and tingling sensations. Patellar dislocation on the right 5 years ago. EXAM: RIGHT KNEE 3 VIEWS COMPARISON:  Right knee dated 06/05/2010. FINDINGS: Minimal posterior patellar spur formation.  No effusion. IMPRESSION: Minimal patellofemoral degenerative change. Electronically Signed   By: Beckie SaltsSteven  Reid M.D.   On: 09/11/2016 15:20   ASSESSMENT & PLAN:   Problem List Items Addressed This Visit    Pain in both knees - Primary    She has a slight lateral tilt to the patella and is having mainly patellofemoral symptoms.  There is no evidence of significant degenerative changes.  We have provided her some exercises to perform.  She will follow-up with us on an as-needed basis  Therapeutic exercises reviewed with Donzetta KohutJames Jefferson, ATC with focus on motor control and strengthening especially of the VMO and hip abductors.  +++++++++++++++++++++++++++++++++++++++++++++++++++++++++++++++ PROCEDURE NOTE: THERAPEUTIC EXERCISES (97110) 15 minutes spent for Therapeutic exercises as stated in above notes.  This included exercises focusing on stretching, strengthening, with significant focus on eccentric aspects.   Proper technique shown and discussed handout in great detail with ATC.  All questions were discussed and answered.        Relevant Orders   DG Knee AP/LAT W/Sunrise Right (Completed)   DG Knee 1-2 Views Left (Completed)      Follow-up: Return if symptoms worsen or fail to improve.    CMA/ATC served as Neurosurgeonscribe during this visit. History, Physical, and Plan performed by medical provider. Documentation and orders reviewed and attested to.      Gaspar BiddingMichael Rigby, DO    [facility] Phys[facility]

## 2016-09-12 DIAGNOSIS — M25562 Pain in left knee: Secondary | ICD-10-CM

## 2016-09-12 DIAGNOSIS — M25561 Pain in right knee: Secondary | ICD-10-CM | POA: Insufficient documentation

## 2016-09-12 NOTE — Assessment & Plan Note (Signed)
She has a slight lateral tilt to the patella and is having mainly patellofemoral symptoms.  There is no evidence of significant degenerative changes.  We have provided her some exercises to perform.  She will follow-up with us on an as-needed basis  Therapeutic exercises reviewed with Natasha Murphy, ATC with focus on motor control and strengthening especially of the VMO and hip abductors.  +++++++++++++++++++++++++++++++++++++++++++++++++++++++++++++++ PROCEDURE NOTE: THERAPEUTIC EXERCISES (97110) 15 minutes spent for Therapeutic exercises as stated in above notes.  This included exercises focusing on stretching, strengthening, with significant focus on eccentric aspects.   Proper technique shown and discussed handout in great detail with ATC.  All questions were discussed and answered.

## 2016-09-25 ENCOUNTER — Ambulatory Visit: Payer: 59 | Admitting: Family Medicine

## 2016-10-01 ENCOUNTER — Telehealth: Payer: Self-pay | Admitting: Emergency Medicine

## 2016-10-01 NOTE — Telephone Encounter (Signed)
Pre visit call attempted left voicemail for pt to return call to office.  

## 2016-10-02 ENCOUNTER — Encounter: Payer: Self-pay | Admitting: Family Medicine

## 2016-10-02 ENCOUNTER — Ambulatory Visit (INDEPENDENT_AMBULATORY_CARE_PROVIDER_SITE_OTHER): Payer: 59 | Admitting: Family Medicine

## 2016-10-02 VITALS — HR 80 | Temp 98.3°F | Wt 204.6 lb

## 2016-10-02 DIAGNOSIS — E669 Obesity, unspecified: Secondary | ICD-10-CM

## 2016-10-02 DIAGNOSIS — M549 Dorsalgia, unspecified: Secondary | ICD-10-CM | POA: Diagnosis not present

## 2016-10-02 DIAGNOSIS — I8390 Asymptomatic varicose veins of unspecified lower extremity: Secondary | ICD-10-CM | POA: Diagnosis not present

## 2016-10-02 DIAGNOSIS — M2550 Pain in unspecified joint: Secondary | ICD-10-CM

## 2016-10-02 DIAGNOSIS — E559 Vitamin D deficiency, unspecified: Secondary | ICD-10-CM | POA: Insufficient documentation

## 2016-10-02 MED ORDER — MELOXICAM 15 MG PO TABS: 15.0000 mg | ORAL_TABLET | Freq: Every day | ORAL | 0 refills | Status: DC

## 2016-10-02 MED ORDER — CHOLECALCIFEROL 1.25 MG (50000 UT) PO TABS: ORAL_TABLET | ORAL | 0 refills | Status: DC

## 2016-10-02 NOTE — Progress Notes (Signed)
Natasha Murphy is a 46 y.o. female [facility]   Patient Care Team: Natasha RimaWallace, Natasha Murphy as PCP - General (Family Medicine)   History of Present Illness:   Natasha BottomJamie Murphy CMA acting as scribe for Dr. Earlene PlaterWallace.  HPI: Patient comes in today to establish care. She has some broken veins in the right leg that she is concerned about. She is wanting to know if she needs referral to a Vascular surgery.   SEE A/P FOR OTHER ISSUES AND PROBLEM BASED CHARTING.   She is also having some bilateral ankle swelling. She sates that this is mainly when she is traveling.   Health Maintenance Due  Topic Date Due  . HIV Screening  12/21/1985   PMHx, SurgHx, SocialHx, Medications, and Allergies were reviewed in the Visit Navigator and updated as appropriate.   Past Medical History:  Diagnosis Date  . HSV-2 seropositive   . Hx of chlamydia infection 1995, 01/2009  . Hx of gonorrhea 1995  . Lactose intolerance   . Varicose veins   . Vitamin D deficiency    Past Surgical History:  Procedure Laterality Date  . CESAREAN SECTION WITH BILATERAL TUBAL LIGATION    . UMBILICAL HERNIA REPAIR  age 89   Family History  Problem Relation Age of Onset  . Hypertension Mother   . Depression Mother   . Cancer Paternal Aunt   . Cancer Paternal Grandmother    Social History  Substance Use Topics  . Smoking status: Never Smoker  . Smokeless tobacco: Never Used  . Alcohol use Yes     Comment: occassional (twice/month)   Current Medications and Allergies:   .  acetaminophen (TYLENOL) 500 MG tablet, Take 500-1,000 mg by mouth every 6 (six) hours as needed for moderate pain., Disp: , Rfl:  .  Alum & Mag Hydroxide-Simeth (ANTACID ANTI-GAS PO), Take 1 tablet by mouth daily as needed (heartburn)., Disp: , Rfl:  .  cetirizine (ZYRTEC ALLERGY) 10 MG tablet, Take 10 mg by mouth daily as needed for allergies. , Disp: , Rfl:  .  Cholecalciferol (VITAMIN D) 2000 units CAPS, Take 1 capsule by mouth  daily., Disp: , Rfl:  .  Cyanocobalamin (VITAMIN B-12 CR PO), Take 1 tablet by mouth daily. , Disp: , Rfl:   Allergies  Allergen Reactions  . Tramadol Shortness Of Breath  . Penicillins Hives   Review of Systems:   Review of Systems  Constitutional: Negative for chills, fever and malaise/fatigue.  HENT: Negative for ear pain, sinus pain and sore throat.   Eyes: Negative for blurred vision and double vision.  Respiratory: Negative for cough, shortness of breath and wheezing.   Cardiovascular: Negative for chest pain, palpitations and leg swelling.  Gastrointestinal: Negative for abdominal pain, nausea and vomiting.  Musculoskeletal: Negative for back pain, joint pain and neck pain.  Neurological: Negative for dizziness and headaches.  Psychiatric/Behavioral: Negative for depression, hallucinations and memory loss.   Vitals:   Vitals:   10/02/16 0909  Pulse: 80  Temp: 98.3 F (36.8 C)  TempSrc: Oral  SpO2: 98%  Weight: 204 lb 9.6 oz (92.8 kg)     Body mass index is 32.04 kg/m.  Physical Exam:   Physical Exam  Constitutional: She appears well-developed and well-nourished. No distress.  HENT:  Head: Normocephalic and atraumatic.  Eyes: EOM are normal. Pupils are equal, round, and reactive to light.  Neck: Normal range of motion. Neck supple.  Cardiovascular: Normal rate, regular rhythm, normal heart  sounds and intact distal pulses.   Pulmonary/Chest: Effort normal.  Abdominal: Soft.  Musculoskeletal:       Right knee: Tenderness found. Medial joint line tenderness noted.       Left knee: Tenderness found. Medial joint line tenderness noted.  C-T paraspinal mm hypertonicity and ttp. FROM cervical spine.   Skin: Skin is warm.  Varicose veins leg, ttp.  Psychiatric: She has a normal mood and affect. Her behavior is normal.  Nursing note and vitals reviewed.  Assessment and Plan:   Natasha Murphy was seen today for establish care.  Diagnoses and all orders for this  visit:  Multiple joint pain -     meloxicam (MOBIC) 15 MG tablet; Take 1 tablet (15 mg total) by mouth daily.  Vitamin D deficiency Comments: Recent diagnosis, but not replaced. Orders: -     Cholecalciferol 50000 units TABS; 50,000 units PO qwk for 8 weeks.  Varicose vein of leg Comments: Irritated but without red flags.  Orders: -     Ambulatory referral to Vascular Surgery  Upper back pain Comments: Patient sits at a desk all day. She has poor posture and a head-forward position. She has chronic pain in her upper back and neck. Stretches reviewed. To Natasha Murphy for OMT. To PT for evaluation and dry needling if appropriate.  Orders: -     Ambulatory referral to Sports Medicine -     Ambulatory referral to Physical Therapy  Obesity (BMI 30-39.9) Comments: The patient is asked to make an attempt to improve diet and exercise patterns to aid in medical management of this problem.    . Reviewed expectations re: course of current medical issues. . Discussed self-management of symptoms. . Outlined signs and symptoms indicating need for more acute intervention. . Patient verbalized understanding and all questions were answered. Marland Kitchen Health Maintenance issues including appropriate healthy diet, exercise, and smoking avoidance were discussed with patient. . See orders for this visit as documented in the electronic medical record. . Patient received an After Visit Summary.  CMA served as Neurosurgeon during this visit. History, Physical, and Plan performed by medical provider. The above documentation has been reviewed and is accurate and complete. Natasha Murphy, D.O.  Natasha Rima, Murphy , [place] 10/07/2016  Future Appointments Date Time Provider Department Center  10/17/2016 2:30 PM [facility] THERAPIST [facility] None  10/17/2016 3:30 PM Natasha Mews, Murphy [facility] None  12/12/2016 9:15 AM Natasha Rima, Murphy [facility] None

## 2016-10-17 ENCOUNTER — Ambulatory Visit: Payer: 59

## 2016-10-17 ENCOUNTER — Ambulatory Visit: Payer: 59 | Admitting: Sports Medicine

## 2016-10-30 ENCOUNTER — Encounter: Payer: Self-pay | Admitting: Sports Medicine

## 2016-10-30 ENCOUNTER — Ambulatory Visit (INDEPENDENT_AMBULATORY_CARE_PROVIDER_SITE_OTHER): Payer: 59

## 2016-10-30 ENCOUNTER — Telehealth: Payer: Self-pay | Admitting: Family Medicine

## 2016-10-30 ENCOUNTER — Ambulatory Visit (INDEPENDENT_AMBULATORY_CARE_PROVIDER_SITE_OTHER): Payer: 59 | Admitting: Sports Medicine

## 2016-10-30 DIAGNOSIS — M542 Cervicalgia: Secondary | ICD-10-CM

## 2016-10-30 DIAGNOSIS — M4712 Other spondylosis with myelopathy, cervical region: Secondary | ICD-10-CM | POA: Diagnosis not present

## 2016-10-30 DIAGNOSIS — Q7649 Other congenital malformations of spine, not associated with scoliosis: Secondary | ICD-10-CM

## 2016-10-30 DIAGNOSIS — M479 Spondylosis, unspecified: Secondary | ICD-10-CM | POA: Insufficient documentation

## 2016-10-30 MED ORDER — DIAZEPAM 5 MG PO TABS: 5.0000 mg | ORAL_TABLET | Freq: Once | ORAL | 0 refills | Status: AC

## 2016-10-30 NOTE — Progress Notes (Signed)
OFFICE VISIT NOTE [name]. Delorise [facility]iner [facility] [phone]  LAVERGNE HILTUNEN - 46 y.o. female MRN [id number]  Date of birth: 31-Jan-1971  Visit Date: 10/30/2016  PCP: Helane Rima, DO   Referred by: Helane Rima, DO  Clovis Cao, CMA acting as scribe for Dr. Berline Chough.  SUBJECTIVE:   Chief Complaint  Patient presents with  . Chonic Pain in Upper Back   HPI: As below and per problem based documentation when appropriate.   Pt presents today with complaint of chronic upper back and neck pain. No radiation of pain. 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 Pending appt with PT on 11/07/2016.     Review of Systems  Constitutional: Negative for chills, diaphoresis, fever, malaise/fatigue and weight loss.  HENT: Negative.   Eyes: Negative.   Respiratory: Negative.   Cardiovascular: Negative.   Gastrointestinal: Negative.   Genitourinary: Negative.   Musculoskeletal: Positive for back pain, falls, joint pain, myalgias and neck pain.  Skin: Negative.   Neurological: Negative.  Negative for weakness.  Endo/Heme/Allergies: Negative.   Psychiatric/Behavioral: Negative.     Otherwise per HPI.  HISTORY & PERTINENT PRIOR DATA:  No specialty comments available. She reports that she has never smoked. She has never used smokeless tobacco. No results for input(s): HGBA1C, LABURIC in the last 8760 hours. Medications & Allergies reviewed per EMR Patient Active Problem List   Diagnosis Date Noted  . 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 Osteoarthritis of cervical spine with myelopathy 10/30/2016  . Vitamin D deficiency 10/02/2016  . Pain in both knees 09/12/2016   Past Medical History:  Diagnosis Date  . HSV-2 seropositive   . Hx of chlamydia infection 1995, 01/2009  . Hx of gonorrhea 1995  . Lactose intolerance   . Varicose veins   . Vitamin D  deficiency    Family History  Problem Relation Age of Onset  . Hypertension Mother   . Depression Mother   . Cancer Paternal Aunt   . Cancer Paternal Grandmother    Past Surgical History:  Procedure Laterality Date  . CESAREAN SECTION WITH BILATERAL TUBAL LIGATION    . UMBILICAL HERNIA REPAIR  age 42   Social History   Occupational History  . Not on file.   Social History Main Topics  . Smoking status: Never Smoker  . Smokeless tobacco: Never Used  . Alcohol use Yes     Comment: occassional (twice/month)  . Drug use: No  . Sexual activity: Yes    OBJECTIVE:  VS:  HT:    WT:   BMI:     BP:   HR: bpm  TEMP: ( )  RESP:  EXAM: Findings:  WDWN, NAD, Non-toxic appearing Alert & appropriately interactive Not depressed or anxious appearing No increased work of breathing. Pupils are equal. EOM intact without nystagmus No clubbing or cyanosis of the extremities appreciated No significant rashes/lesions/ulcerations overlying the examined area. Radial pulses 2+/4.  No significant generalized UE edema. Generalized dysesthesia in the left upper extremity in a nondermatomal distribution.  No significant pain with Tinel's at the wrist  Neck & Shoulders: Well aligned, no significant torticollis No significant midline tenderness.   Generalized TTP over the left periscapular region as well as anterior arm but no focal trigger points appreciated Cervical ROM:       Flexion: 50  Extension: 40      Right   - Rotation: 60 Sidebending: 20       Left     - Rotation: 60 Sidebending: 30  NEURAL TENSION SIGNS Right       Brachial Plexus Squeeze: Nontender      Arm Squeeze Test: Non-tender      Spurling's Compression Test:  Ipsilateral -Negative/ No radiation            Left       Brachial Plexus Squeeze: Mild TTP       Arm Squeeze Test: Moderate TTP       Spurling's Compression Test:  Ipsilateral -localized pain to the left periscapular/levator scapular region with no  significant radicular symptoms  Lhermitte's Compression test:  Slight radiation into the periscapular region   REFLEXES                           Right                         Left DTR - C5 -Biceps               3+/4                       3+/4 DTR - C6 - Brachiorad  3+/4                       Trace DTR - C7 - Triceps              3+/4                       Trace UMN - Hoffman's   +Thumb & Index Negative/Normal  MOTOR TESTING: Intact in all UE myotomes     Dg Cervical Spine Complete  Result Date: 10/30/2016 CLINICAL DATA:  Chronic neck pain, no recent injury EXAM: CERVICAL SPINE - COMPLETE 4+ VIEW COMPARISON:  None. FINDINGS: There is apparent fusion of C2 and C3. The cervical vertebrae are straightened in alignment. Degenerative disc disease is noted at C4-5, C5-6, and to a lesser degree, at C6-7. At these levels there is loss of disc space with sclerosis and spurring. No prevertebral soft tissue swelling is seen. There is bilateral foraminal narrowing at C4-5, C5-6, and C6-7 levels. The odontoid process is intact. The lung apices are clear. IMPRESSION: 1. Straightened alignment with degenerative disc disease at C4-5, C5-6 and C6-7 levels. 2. Fusion of C2-3 possibly congenital. Electronically Signed   By: Dwyane DeePaul  Barry M.D.   On: 10/30/2016 16:19   ASSESSMENT & PLAN:     ICD-10-CM   1. Neck pain M54.2 DG Cervical Spine Complete    MR Cervical Spine Wo Contrast  2. Osteoarthritis of cervical spine with myelopathy M47.12 MR Cervical Spine Wo Contrast  3. Congenital anomaly of cervical spine Q76.49   ================================================================= Osteoarthritis of cervical spine with myelopathy I suspect neck and upper back pain she is experiencing is coming from a fairly significant cervical radiculitis.  She does have positive Hoffman's on the right hand with a normal on the left but is generalizability hyperreflexic however given the fact that her symptoms are on the left  Hoffman's is quite worrisome.  MRI of the cervical spine dated at this time given potential for significant myelopathy and possible cervical spinal cord compression.  We will plan to  follow-up with her regarding the results of the MRI did discuss this may warrant a to neurosurgery.  Congenital anomaly of cervical spine Interestingly she does have what appears to be an underlying congenital fusion of C2 and 3  ================================================================= Follow-up: Return for after your MRI, MRI review.   CMA/ATC served as Neurosurgeon during this visit. History, Physical, and Plan performed by medical provider. Documentation and orders reviewed and attested to.      Gaspar Bidding, DO    [facility] Physician

## 2016-10-30 NOTE — Telephone Encounter (Signed)
[facility] called regarding the patient's [facility]. The referral was placed in a non active workqueue [facility] and it needed to be [facility]. This has been adjusted and the referral coordinator is aware as well. [facility] will call patient to schedule. No further action required.

## 2016-10-30 NOTE — Patient Instructions (Signed)

## 2016-10-30 NOTE — Telephone Encounter (Signed)
noted 

## 2016-10-31 DIAGNOSIS — Q7649 Other congenital malformations of spine, not associated with scoliosis: Secondary | ICD-10-CM | POA: Insufficient documentation

## 2016-10-31 NOTE — Assessment & Plan Note (Signed)
I suspect neck and upper back pain she is experiencing is coming from a fairly significant cervical radiculitis.  She does have positive Hoffman's on the right hand with a normal on the left but is generalizability hyperreflexic however given the fact that her symptoms are on the left Hoffman's is quite worrisome.  MRI of the cervical spine dated at this time given potential for significant myelopathy and possible cervical spinal cord compression.  We will plan to follow-up with her regarding the results of the MRI did discuss this may warrant a to neurosurgery.

## 2016-10-31 NOTE — Assessment & Plan Note (Signed)
Interestingly she does have what appears to be an underlying congenital fusion of C2 and 3

## 2016-11-07 ENCOUNTER — Other Ambulatory Visit (HOSPITAL_COMMUNITY)
Admission: RE | Admit: 2016-11-07 | Discharge: 2016-11-07 | Disposition: A | Discharge status: Home / Self Care | Payer: 59 | Source: Ambulatory Visit | Attending: Obstetrics and Gynecology | Admitting: Obstetrics and Gynecology

## 2016-11-07 ENCOUNTER — Other Ambulatory Visit: Payer: Self-pay | Admitting: Obstetrics and Gynecology

## 2016-11-07 ENCOUNTER — Ambulatory Visit: Payer: 59

## 2016-11-07 DIAGNOSIS — Z124 Encounter for screening for malignant neoplasm of cervix: Secondary | ICD-10-CM | POA: Insufficient documentation

## 2016-11-09 LAB — CYTOLOGY - PAP
Chlamydia: NEGATIVE
Diagnosis: NEGATIVE
HPV: NOT DETECTED
Neisseria Gonorrhea: NEGATIVE

## 2016-11-12 ENCOUNTER — Other Ambulatory Visit: Payer: Self-pay

## 2016-11-12 ENCOUNTER — Ambulatory Visit: Payer: 59 | Admitting: Sports Medicine

## 2016-11-12 DIAGNOSIS — I83813 Varicose veins of bilateral lower extremities with pain: Secondary | ICD-10-CM

## 2016-11-21 ENCOUNTER — Ambulatory Visit
Admission: RE | Admit: 2016-11-21 | Discharge: 2016-11-21 | Disposition: A | Discharge status: Home / Self Care | Payer: 59 | Source: Ambulatory Visit | Attending: Sports Medicine | Admitting: Sports Medicine

## 2016-11-21 DIAGNOSIS — M542 Cervicalgia: Secondary | ICD-10-CM

## 2016-11-21 DIAGNOSIS — M4712 Other spondylosis with myelopathy, cervical region: Secondary | ICD-10-CM

## 2016-11-29 ENCOUNTER — Telehealth: Payer: Self-pay | Admitting: Family Medicine

## 2016-11-29 NOTE — Telephone Encounter (Signed)
I spoke to Dr. Berline Chough and he wants patient to hold off on PT. Patient has an appointment with Dr. Berline Chough on 08/29 to review results and plan for next steps. I cancelled PT order in EPIC.

## 2016-12-05 ENCOUNTER — Ambulatory Visit (INDEPENDENT_AMBULATORY_CARE_PROVIDER_SITE_OTHER): Payer: 59 | Admitting: Sports Medicine

## 2016-12-05 ENCOUNTER — Encounter: Payer: Self-pay | Admitting: Sports Medicine

## 2016-12-05 VITALS — BP 110/76 | HR 88 | Ht 67.0 in | Wt 205.2 lb

## 2016-12-05 DIAGNOSIS — M4722 Other spondylosis with radiculopathy, cervical region: Secondary | ICD-10-CM

## 2016-12-05 DIAGNOSIS — Q7649 Other congenital malformations of spine, not associated with scoliosis: Secondary | ICD-10-CM

## 2016-12-05 MED ORDER — GABAPENTIN 100 MG PO CAPS: ORAL_CAPSULE | ORAL | 1 refills | Status: DC

## 2016-12-05 NOTE — Patient Instructions (Signed)
Please start the gabapentin.   if it is helping is okay to only take this twice per day after the first week.  We will refer you to physical therapy as well.  And we can discuss using a TENS unit at that time.

## 2016-12-05 NOTE — Progress Notes (Signed)
OFFICE VISIT NOTE Natasha Murphy. Natasha Murphy Sports Medicine [facility] [phone]  Natasha Murphy - 46 y.o. female MRN [id number]  Date of birth: Jun 01, 1970  Visit Date: 12/05/2016  PCP: Helane Rima, DO   Referred by: Helane Rima, DO  Orlie Dakin, CMA acting as scribe for Dr. Berline Chough.  SUBJECTIVE:   Chief Complaint  Patient presents with  . Follow-up    neck pain, MRI review   HPI: As below and per problem based documentation when appropriate.  Natasha Murphy is an established patient presenting today in follow-up of recent MRI. She had MRI c-spine 11/21/2016 and is here today to review the results and discuss treatment options.  She denies any significant worsening weakness or abnormalities changes in bowel or bladder.  Pain is persistently in the left shoulder and arm.    Review of Systems  Constitutional: Negative for chills and fever.  Respiratory: Negative for shortness of breath and wheezing.   Cardiovascular: Negative for chest pain and palpitations.  Musculoskeletal: Positive for neck pain. Negative for falls.  Neurological: Negative for dizziness, tingling and headaches.  Endo/Heme/Allergies: Does not bruise/bleed easily.    Otherwise per HPI.  HISTORY & PERTINENT PRIOR DATA:  No specialty comments available. She reports that she has never smoked. She has never used smokeless tobacco. No results for input(s): HGBA1C, LABURIC in the last 8760 hours. Medications & Allergies reviewed per EMR Patient Active Problem List   Diagnosis Date Noted  . 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 Osteoarthritis of spine 10/30/2016  . Vitamin D deficiency 10/02/2016  . Pain in both knees 09/12/2016   Past Medical History:  Diagnosis Date  . HSV-2 seropositive   . Hx of chlamydia infection 1995, 01/2009  . Hx of gonorrhea 1995  . Lactose intolerance   . Varicose veins   . Vitamin D deficiency    Family History  Problem  Relation Age of Onset  . Hypertension Mother   . Depression Mother   . Cancer Paternal Aunt   . Cancer Paternal Grandmother    Past Surgical History:  Procedure Laterality Date  . CESAREAN SECTION WITH BILATERAL TUBAL LIGATION    . UMBILICAL HERNIA REPAIR  age 3   Social History   Occupational History  . Not on file.   Social History Main Topics  . Smoking status: Never Smoker  . Smokeless tobacco: Never Used  . Alcohol use Yes     Comment: occassional (twice/month)  . Drug use: No  . Sexual activity: Yes    OBJECTIVE:  VS:  HT:5\' 7"  (170.2 cm)   WT:205 lb 3.2 oz (93.1 kg)  BMI:32.13    BP:110/76  HR:88bpm  TEMP: ( )  RESP:98 % EXAM: Findings:  WDWN, NAD, Non-toxic appearing Alert & appropriately interactive Not depressed or anxious appearing No increased work of breathing. Pupils are equal. EOM intact without nystagmus No clubbing or cyanosis of the extremities appreciated No significant rashes/lesions/ulcerations overlying the examined area. Radial pulses 2+/4.  No significant generalized UE edema. Generalized dysesthesia in the left upper extremity compared to the right but no focal dermatomal distribution.  Neck & Shoulders: Well aligned, no significant torticollis No significant midline tenderness.   Generalized TTP over the left paraspinal cervical musculature. Cervical ROM:       Normal flexion and extension.      Side bending to the left is limited Greater than to the right but both are significantly decreased from baseline.  NEURAL TENSION SIGNS Right       Brachial Plexus Squeeze: Non-tender       Arm Squeeze Test: Non-tender      Spurling's Compression Test:  Ipsilateral -Negative/ No radiation  Left       Brachial Plexus Squeeze: Mildly tender, no radicular symptoms       Arm Squeeze Test: Non-tender       Spurling's Compression Test:  Ipsilateral -mild pain but no significant radicular component  Lhermitte's Compression test:  Negative/ No  radiation   REFLEXES                           Right                         Left DTR - C5 -Biceps               2+/4                       2+/4 DTR - C6 - Brachiorad 2+/4                       2+/4 DTR - C7 - Triceps              2+/4                       2+/4 UMN - Hoffman's Negative/Normal Negative/Normal  MOTOR TESTING: Intact in all UE myotomes.  Slightly diminished strength in the left upper extremity compared to the right diffusely but nonfocal.       Mr Cervical Spine Wo Contrast  Result Date: 11/21/2016 CLINICAL DATA:  Neck pain and stiffness for 4 years. No known injury. EXAM: MRI CERVICAL SPINE WITHOUT CONTRAST TECHNIQUE: Multiplanar, multisequence MR imaging of the cervical spine was performed. No intravenous contrast was administered. COMPARISON:  Plain film cervical spine 10/30/2016. FINDINGS: Alignment: There is straightening of the normal cervical lordosis. Vertebrae: No fracture or worrisome lesion. There is partial congenital fusion across the C2-3 disc interspace and the facet joints are fused consistent with Klippel-Feil deformity. Cord: Normal signal throughout. Posterior Fossa, vertebral arteries, paraspinal tissues: Negative. Disc levels: C2-3: Congenitally fused. The central canal and foramina are widely patent. C3-4: Shallow left paracentral protrusion without central canal or foraminal stenosis. C4-5: There is some posterior bony ridging and uncovertebral disease. The ventral thecal sac is effaced. The right foramen is open. Mild left foraminal narrowing is noted. C5-6: Disc osteophyte complex and uncovertebral disease are seen. The ventral thecal sac is effaced and there is mild flattening of the right cord. The foramina are open. C6-7: Shallow disc bulge and uncovertebral disease are identified. The central canal is open. Mild foraminal narrowing is more notable on the left. C7-T1:  Mild facet degenerative change.  Otherwise negative. T1-2:  Negative. 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 Please correlate with plain films if any intervention is planned. Disc osteophyte complex at C4-5 effaces the ventral thecal sac. There is also mild left foraminal narrowing at this level. Disc osteophyte complex eccentric to the right at C5-6 mildly deform the right hemicord. The foramina are open. Electronically Signed   By: Drusilla Kannerhomas  Dalessio M.D.   On: 11/21/2016 10:41   ASSESSMENT & PLAN:     ICD-10-CM   1. Congenital anomaly of cervical spine Q76.49   2. Osteoarthritis  of spine with radiculopathy, cervical region M47.22   ================================================================= Osteoarthritis of spine Symptoms are consistent with a radiculitis at this time.  No evidence of upper motor neuron involvement although there is a slight indentation of the cervical cord.  We discussed multiple options including epidural steroid injection versus continued conservative management versus referral to neurosurgery and she would like to continue with conservative management of given the overall slight improvement in symptoms that she has had.  Formal physical therapy referral indicated.  Her Hoffman's sign has completely normalized at this time and I suspect this was likely reflective more so of a generalized hyperreflexia.  If any recurrent symptoms or lack of improvement epidural steroid injection will be indicated.  Congenital anomaly of cervical spine  Consistent with a Klippel-Feil congenital anomaly.  No further indication or evaluation indicated at this time.  Will need to be cognizant of this at the time of intervention if it comes to that. ================================================================= Patient Instructions  Please start the gabapentin.   if it is helping is okay to only take this twice per day after the first week.  We will refer you to physical therapy as well.  And we can discuss using a TENS unit at  that time. =================================================================  Follow-up: Return in about 6 weeks (around 01/16/2017) for +++schedule an appointment with Lauren for PT +++.   CMA/ATC served as Neurosurgeon during this visit. History, Physical, and Plan performed by medical provider. Documentation and orders reviewed and attested to.      Gaspar Bidding, DO    [facility] Physician

## 2016-12-11 NOTE — Assessment & Plan Note (Signed)
  Consistent with a Klippel-Feil congenital anomaly.  No further indication or evaluation indicated at this time.  Will need to be cognizant of this at the time of intervention if it comes to that.

## 2016-12-11 NOTE — Assessment & Plan Note (Signed)
Symptoms are consistent with a radiculitis at this time.  No evidence of upper motor neuron involvement although there is a slight indentation of the cervical cord.  We discussed multiple options including epidural steroid injection versus continued conservative management versus referral to neurosurgery and she would like to continue with conservative management of given the overall slight improvement in symptoms that she has had.  Formal physical therapy referral indicated.  Her Hoffman's sign has completely normalized at this time and I suspect this was likely reflective more so of a generalized hyperreflexia.  If any recurrent symptoms or lack of improvement epidural steroid injection will be indicated.

## 2016-12-12 ENCOUNTER — Ambulatory Visit (INDEPENDENT_AMBULATORY_CARE_PROVIDER_SITE_OTHER): Payer: 59 | Admitting: Physical Therapy

## 2016-12-12 ENCOUNTER — Encounter: Payer: 59 | Admitting: Family Medicine

## 2016-12-12 ENCOUNTER — Ambulatory Visit (INDEPENDENT_AMBULATORY_CARE_PROVIDER_SITE_OTHER): Payer: 59 | Admitting: Family Medicine

## 2016-12-12 ENCOUNTER — Encounter: Payer: Self-pay | Admitting: Physical Therapy

## 2016-12-12 ENCOUNTER — Ambulatory Visit: Payer: 59

## 2016-12-12 ENCOUNTER — Encounter: Payer: Self-pay | Admitting: Family Medicine

## 2016-12-12 VITALS — BP 110/80 | HR 69 | Ht 67.0 in | Wt 207.2 lb

## 2016-12-12 DIAGNOSIS — M542 Cervicalgia: Secondary | ICD-10-CM | POA: Diagnosis not present

## 2016-12-12 DIAGNOSIS — E559 Vitamin D deficiency, unspecified: Secondary | ICD-10-CM | POA: Diagnosis not present

## 2016-12-12 DIAGNOSIS — R5383 Other fatigue: Secondary | ICD-10-CM | POA: Diagnosis not present

## 2016-12-12 DIAGNOSIS — R7989 Other specified abnormal findings of blood chemistry: Secondary | ICD-10-CM

## 2016-12-12 LAB — CBC WITH DIFFERENTIAL/PLATELET
Basophils Absolute: 0 10*3/uL (ref 0.0–0.1)
Basophils Relative: 0.3 % (ref 0.0–3.0)
Eosinophils Absolute: 0.1 10*3/uL (ref 0.0–0.7)
Eosinophils Relative: 0.9 % (ref 0.0–5.0)
HCT: 39.7 % (ref 36.0–46.0)
Hemoglobin: 13.1 g/dL (ref 12.0–15.0)
Lymphocytes Relative: 28.3 % (ref 12.0–46.0)
Lymphs Abs: 2.3 10*3/uL (ref 0.7–4.0)
MCHC: 32.9 g/dL (ref 30.0–36.0)
MCV: 93 fl (ref 78.0–100.0)
Monocytes Absolute: 0.3 10*3/uL (ref 0.1–1.0)
Monocytes Relative: 4.1 % (ref 3.0–12.0)
Neutro Abs: 5.4 10*3/uL (ref 1.4–7.7)
Neutrophils Relative %: 66.4 % (ref 43.0–77.0)
Platelets: 213 10*3/uL (ref 150.0–400.0)
RBC: 4.27 Mil/uL (ref 3.87–5.11)
RDW: 14.4 % (ref 11.5–15.5)
WBC: 8.1 10*3/uL (ref 4.0–10.5)

## 2016-12-12 LAB — VITAMIN D 25 HYDROXY (VIT D DEFICIENCY, FRACTURES): VITD: 23.33 ng/mL — ABNORMAL LOW (ref 30.00–100.00)

## 2016-12-12 LAB — COMPREHENSIVE METABOLIC PANEL
ALT: 10 U/L (ref 0–35)
AST: 14 U/L (ref 0–37)
Albumin: 3.9 g/dL (ref 3.5–5.2)
Alkaline Phosphatase: 77 U/L (ref 39–117)
BUN: 11 mg/dL (ref 6–23)
CO2: 25 mEq/L (ref 19–32)
Calcium: 9.5 mg/dL (ref 8.4–10.5)
Chloride: 103 mEq/L (ref 96–112)
Creatinine, Ser: 0.88 mg/dL (ref 0.40–1.20)
GFR: 88.98 mL/min (ref >60.00)
Glucose, Bld: 96 mg/dL (ref 70–99)
Potassium: 4.4 mEq/L (ref 3.5–5.1)
Sodium: 136 mEq/L (ref 135–145)
Total Bilirubin: 0.3 mg/dL (ref 0.2–1.2)
Total Protein: 7 g/dL (ref 6.0–8.3)

## 2016-12-12 LAB — VITAMIN B12: Vitamin B-12: 572 pg/mL (ref 211–911)

## 2016-12-12 LAB — TSH: TSH: 1.1 u[IU]/mL (ref 0.35–4.50)

## 2016-12-12 NOTE — Patient Instructions (Signed)
May use Pamprin for menstrual symptoms.

## 2016-12-12 NOTE — Therapy (Signed)
[facility] [address] [place], [place], [address] Phone: [phone]   Fax:  [phone]  Physical Therapy Evaluation  Patient Details  Name: Natasha Murphy MRN: [id number] Date of Birth: 10-05-1970 Referring Provider: Gaspar Bidding  Encounter Date: 12/12/2016      PT End of Session - 12/12/16 1036    Visit Number 1   Number of Visits 12   Date for PT Re-Evaluation 01/23/17   PT Start Time 1005   PT Stop Time 1050   PT Time Calculation (min) 45 min   Activity Tolerance Patient tolerated treatment well   Behavior During Therapy [facility] for tasks assessed/performed      Past Medical History:  Diagnosis Date  . HSV-2 seropositive   . Hx of chlamydia infection 1995, 01/2009  . Hx of gonorrhea 1995  . Lactose intolerance   . Varicose veins   . Vitamin D deficiency     Past Surgical History:  Procedure Laterality Date  . CESAREAN SECTION WITH BILATERAL TUBAL LIGATION    . UMBILICAL HERNIA REPAIR  age 1    There were no vitals filed for this visit.       Subjective Assessment - 12/12/16 1011    Pertinent History Pt states previous history of neck pain, mostly increased with work duties, that has come and gone, and usually resolves itself. She has had recent flare up of pain, with no incident to report. She does sit at desk for work, does mostly computer and phone work. She denies any numbness/tingling, but has increased pain in L side of neck .    Limitations Sitting   Diagnostic tests MRI   Patient Stated Goals Decreased pain, working/sitting without pain.    Currently in Pain? Yes   Pain Score 2    Pain Location Neck   Pain Orientation Left;Upper   Pain Descriptors / Indicators Tightness;Aching   Pain Type Acute pain   Pain Onset More than a month ago   Pain Frequency Intermittent   Aggravating Factors  Work duties, sitting   Pain Relieving Factors Rest/non-work days            [facility] PT Assessment - 12/12/16  0001      Assessment   Medical Diagnosis 01/16/17   Referring Provider Gaspar Bidding   Hand Dominance Right   Next MD Visit 01/16/17     Precautions   Precautions None     Balance Screen   Has the patient fallen in the past 6 months No     Prior Function   Level of Independence Independent     ROM / Strength   AROM / PROM / Strength AROM     AROM   Overall AROM Comments Cervical ROM: mild limitation with L rotation and L side bending with increased pain.;  L shoulder AROM: WNL, but painful with full elevation    AROM Assessment Site --     Special Tests    Special Tests --  Negative neural tension            Objective measurements completed on examination: See above findings.          [facility] Adult PT Treatment/Exercise - 12/12/16 0001      Exercises   Exercises Neck     Neck Exercises: Theraband   Scapula Retraction 20 reps     Neck Exercises: Seated   Shoulder Rolls 20 reps     Modalities   Modalities Moist Heat  Moist Heat Therapy   Number Minutes Moist Heat 10 Minutes   Moist Heat Location Cervical     Manual Therapy   Manual Therapy Soft tissue mobilization;Joint mobilization;Passive ROM   Joint Mobilization Cervical side glides, distraction   Soft tissue mobilization L upper trap   Passive ROM Cervical rotation      Neck Exercises: Stretches   Upper Trapezius Stretch 3 reps;30 seconds                PT Education - 12/12/16 1016    Education provided Yes   Education Details Posture, work posture,    Person(s) Educated Patient   Methods Explanation;Handout   Comprehension Verbalized understanding          PT Short Term Goals - 12/12/16 1039      PT SHORT TERM GOAL #1   Title Pt to demo decreased pain in neck, to 2/10 with work activities    Time 2   Period Weeks   Status New   Target Date 12/26/16           PT Long Term Goals - 12/12/16 1039      PT LONG TERM GOAL #1   Title Pt to demo decreased pain in  neck, to 0-1/10 with sitting work duties   Time 6   Period Weeks   Status New   Target Date 01/23/17     PT LONG TERM GOAL #2   Title Pt to demo improved cervical ROM to be WNL and pain free, to improve ability for ADLS and work duties    Time 6   Period Weeks   Status New   Target Date 01/23/17     PT LONG TERM GOAL #3   Title Pt to demo ability for full L shoulder AROM without increased pain, to improve abiity for IADLs and reaching, lifting, carrying.    Time 6   Period Weeks   Status New   Target Date 01/23/17                Plan - 12/12/16 1107    Clinical Impression Statement Pt presents with primary complaint of increased pain in cervical region, on L. She has decreased cervical ROM, mostly for rotation, with increased pain. She also has increased muscle tightness and spasm at L upper trap region. She has increased pain with shoulder AROM and functional reaching with UE. Pt limited with work tasks, ADLs, and IADLs due to pain. Pt to benefit from skilled care to improve deficits, and to return to pain free activity .    Clinical Presentation Stable   Clinical Decision Making Low   Rehab Potential Good   PT Frequency 2x / week   PT Duration 6 weeks   PT Home Exercise Plan Upper trap stretch; scap squeezes, shoulder rolls.       Patient will benefit from skilled therapeutic intervention in order to improve the following deficits and impairments:  Hypomobility, Decreased strength, Impaired UE functional use, Pain, Increased muscle spasms, Decreased range of motion, Improper body mechanics  Visit Diagnosis: Cervicalgia - Plan: PT plan of care cert/re-cert     Problem List Patient Active Problem List   Diagnosis Date Noted  . 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 Osteoarthritis of spine 10/30/2016  . Vitamin D deficiency 10/02/2016  . Pain in both knees 09/12/2016   Sedalia Muta, PT, DPT 2:04 PM  12/12/16   . [facility] [address] , [place], [address] Phone: [phone]   Fax:  [phone]  Name: Natasha Murphy MRN: [id number] Date of Birth: Aug 25, 1970

## 2016-12-12 NOTE — Progress Notes (Signed)
Natasha Murphy, cma is acting as a Education administrator for [employer], DO.   Subjective:    Natasha Murphy is a 46 y.o. female and is here for a comprehensive physical exam.  HPI: No acute concerns.   Depression screen PHQ 2/9 09/11/2016  Decreased Interest 0  Down, Depressed, Hopeless 0  PHQ - 2 Score 0   PMHx, SurgHx, SocialHx, Medications, and Allergies were reviewed in the Visit Navigator and updated as appropriate.   Past Medical History:  Diagnosis Date  . HSV-2 seropositive   . Hx of chlamydia infection 1995, 01/2009  . Hx of gonorrhea 1995  . Lactose intolerance   . Varicose veins   . Vitamin D deficiency    Past Surgical History:  Procedure Laterality Date  . CESAREAN SECTION WITH BILATERAL TUBAL LIGATION    . UMBILICAL HERNIA REPAIR  age 17   Family History  Problem Relation Age of Onset  . Hypertension Mother   . Depression Mother   . Cancer Paternal Aunt   . Cancer Paternal Grandmother    Social History  Substance Use Topics  . Smoking status: Never Smoker  . Smokeless tobacco: Never Used  . Alcohol use Yes     Comment: occassional (twice/month)   Review of Systems:   Review of Systems  Constitutional: Negative for chills, fever, malaise/fatigue and weight loss.  Respiratory: Negative for cough, shortness of breath and wheezing.   Cardiovascular: Negative for chest pain, palpitations and leg swelling.  Gastrointestinal: Negative for abdominal pain, constipation, diarrhea, nausea and vomiting.  Genitourinary: Negative for dysuria and urgency.  Musculoskeletal: Negative for joint pain and myalgias.  Skin: Negative for rash.  Neurological: Negative for dizziness and headaches.  Psychiatric/Behavioral: Negative for depression, substance abuse and suicidal ideas. The patient is not nervous/anxious.    Objective:   BP 110/80   Pulse 69   Ht '5\' 7"'  (1.702 m)   Wt 207 lb 3.2 oz (94 kg)   LMP 12/12/2016 (Exact Date)   SpO2 98%   BMI 32.45 kg/m  Body mass  index is 32.45 kg/m.  General Appearance:    Alert, cooperative, no distress, appears stated age  Head:    Normocephalic, without obvious abnormality, atraumatic  Eyes:    PERRL, conjunctiva/corneas clear, EOM's intact, fundi    benign, both eyes  Ears:    Normal TM's and external ear canals, both ears  Nose:   Nares normal, septum midline, mucosa normal, no drainage    or sinus tenderness  Throat:   Lips, mucosa, and tongue normal; teeth and gums normal  Neck:   Supple, symmetrical, trachea midline, no adenopathy;    thyroid:  no enlargement/tenderness/nodules; no carotid   bruit or JVD  Back:     Symmetric, no curvature, ROM normal, no CVA tenderness  Lungs:     Clear to auscultation bilaterally, respirations unlabored  Chest Wall:    No tenderness or deformity   Heart:    Regular rate and rhythm, S1 and S2 normal, no murmur, rub   or gallop  Abdomen:     Soft, non-tender, bowel sounds active all four quadrants,    no masses, no organomegaly  Extremities:   Extremities normal, atraumatic, no cyanosis or edema  Pulses:   2+ and symmetric all extremities  Skin:   Skin color, texture, turgor normal, no rashes or lesions  Lymph nodes:   Cervical, supraclavicular, and axillary nodes normal  Neurologic:   CNII-XII intact, normal strength, sensation and reflexes  throughout   Assessment/Plan:   Jiselle was seen today for annual exam.  Diagnoses and all orders for this visit:  Fatigue, unspecified type -     CBC with Differential/Platelet -     Comp Met (CMET) -     TSH -     Vitamin B12  Low vitamin D level -     VITAMIN D 25 Hydroxy (Vit-D Deficiency, Fractures)   Well Adult Exam: Labs ordered: Yes. Patient counseling was done. See below for items discussed. Discussed the patient's BMI.  The BMI BMI is not in the acceptable range; BMI management plan is completed Follow up in one year. Breast cancer screening: up to date. Cervical cancer screening: up to date.   Patient  Counseling: '[x]'    Nutrition: Stressed importance of moderation in sodium/caffeine intake, saturated fat and cholesterol, caloric balance, sufficient intake of fresh fruits, vegetables, fiber, calcium, iron, and 1 mg of folate supplement per day (for females capable of pregnancy).  '[x]'    Stressed the importance of regular exercise.   '[x]'    Substance Abuse: Discussed cessation/primary prevention of tobacco, alcohol, or other drug use; driving or other dangerous activities under the influence; availability of treatment for abuse.   '[x]'    Injury prevention: Discussed safety belts, safety helmets, smoke detector, smoking near bedding or upholstery.   '[x]'    Sexuality: Discussed sexually transmitted diseases, partner selection, use of condoms, avoidance of unintended pregnancy  and contraceptive alternatives.  '[x]'    Dental health: Discussed importance of regular tooth brushing, flossing, and dental visits.  '[x]'    Health maintenance and immunizations reviewed. Please refer to Health maintenance section.   CMA or LPN served as scribe during this visit. History, Physical, and Plan performed by medical provider. Documentation and orders reviewed and attested to.  Inda Coke, PA-C [place]

## 2016-12-17 MED ORDER — CHOLECALCIFEROL 1.25 MG (50000 UT) PO TABS: ORAL_TABLET | ORAL | 0 refills | Status: DC

## 2016-12-17 NOTE — Addendum Note (Signed)
Addended by: Helane RimaWALLACE, Glennie Rodda R on: 12/17/2016 08:07 AM   Modules accepted: Orders

## 2016-12-19 ENCOUNTER — Encounter: Payer: Self-pay | Admitting: Physical Therapy

## 2016-12-19 ENCOUNTER — Ambulatory Visit (INDEPENDENT_AMBULATORY_CARE_PROVIDER_SITE_OTHER): Payer: 59 | Admitting: Physical Therapy

## 2016-12-19 DIAGNOSIS — M542 Cervicalgia: Secondary | ICD-10-CM

## 2016-12-19 NOTE — Therapy (Signed)
[facility] [address] [address] [place], [place], [address] Phone: [phone]   Fax:  [phone]  Physical Therapy Treatment  Patient Details  Name: Natasha Murphy MRN: [id number] Date of Birth: 08-30-1970 Referring Provider: Gaspar BiddingMichael Rigby  Encounter Date: 12/19/2016      PT End of Session - 12/19/16 1401    Visit Number 2   Number of Visits 12   Date for PT Re-Evaluation 01/23/17   PT Start Time 1308   PT Stop Time 1405   PT Time Calculation (min) 57 min   Activity Tolerance Patient tolerated treatment well   Behavior [facility] Therapy [facility] for tasks assessed/performed      Past Medical History:  Diagnosis Date  . HSV-2 seropositive   . Hx of chlamydia infection 1995, 01/2009  . Hx of gonorrhea 1995  . Lactose intolerance   . Varicose veins   . Vitamin D deficiency     Past Surgical History:  Procedure Laterality Date  . CESAREAN SECTION WITH BILATERAL TUBAL LIGATION    . UMBILICAL HERNIA REPAIR  age 46    There were no vitals filed for this visit.      Subjective Assessment - 12/19/16 1359    Subjective Pt states that her job has been very stressful this week. She has been doing HEP, less frequently than recommended. She states soreness and tightness in L upper trap region.    Currently in Pain? Yes   Pain Location Neck   Pain Orientation Left   Pain Descriptors / Indicators Tightness   Pain Type Acute pain   Pain Onset More than a month ago   Pain Frequency Intermittent                         [facility] Adult PT Treatment/Exercise - 12/19/16 0001      Neck Exercises: Theraband   Scapula Retraction 20 reps     Neck Exercises: Seated   Neck Retraction 20 reps   Shoulder Rolls 20 reps     Modalities   Modalities Moist Heat     Moist Heat Therapy   Number Minutes Moist Heat 10 Minutes   Moist Heat Location Cervical     Manual Therapy   Manual Therapy Soft tissue mobilization;Joint  mobilization;Passive ROM;Taping   Joint Mobilization manual distraction,    Soft tissue mobilization L upper trap   Passive ROM Cervical rotation, upper trap stretching   Kinesiotex Inhibit Muscle     Kinesiotix   Inhibit Muscle  Left Upper trap     Neck Exercises: Stretches   Upper Trapezius Stretch 3 reps;30 seconds                PT Education - 12/19/16 1401    Education provided Yes   Education Details Posture, work posture, heat, education on taping use and precautions.    Person(s) Educated Patient   Methods Explanation   Comprehension Verbalized understanding          PT Short Term Goals - 12/12/16 1039      PT SHORT TERM GOAL #1   Title Pt to demo decreased pain in neck, to 2/10 with work activities    Time 2   Period Weeks   Status New   Target Date 12/26/16           PT Long Term Goals - 12/12/16 1039      PT LONG TERM GOAL #1   Title Pt  to demo decreased pain in neck, to 0-1/10 with sitting work duties   Time 6   Period Weeks   Status New   Target Date 01/23/17     PT LONG TERM GOAL #2   Title Pt to demo improved cervical ROM to be WNL and pain free, to improve ability for ADLS and work duties    Time 6   Period Weeks   Status New   Target Date 01/23/17     PT LONG TERM GOAL #3   Title Pt to demo ability for full L shoulder AROM without increased pain, to improve abiity for IADLs and reaching, lifting, carrying.    Time 6   Period Weeks   Status New   Target Date 01/23/17               Plan - 12/19/16 1402    Clinical Impression Statement Ther ex and HEP reviewed today for posture, pt requires mod-max cuing for mechanics and correct performance. Manual therapy done for significant tightness and soreness in L upper trap region. K-tape done to same region for inhibition of upper trap. Pt educated on postural exercises to perform throught the work day.    Rehab Potential Good   PT Frequency 2x / week   PT Duration 6 weeks    PT Next Visit Plan Assess effects of taping, progress ther ex for posture    PT Home Exercise Plan Upper trap stretch; scap squeezes, shoulder rolls.       Patient will benefit from skilled therapeutic intervention in order to improve the following deficits and impairments:  Hypomobility, Decreased strength, Impaired UE functional use, Pain, Increased muscle spasms, Decreased range of motion, Improper body mechanics  Visit Diagnosis: Cervicalgia     Problem List Patient Active Problem List   Diagnosis Date Noted  . 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 Osteoarthritis of spine 10/30/2016  . Vitamin D deficiency 10/02/2016  . Pain in both knees 09/12/2016    Sedalia Muta, PT, DPT 2:05 PM  12/19/16    [facility] [address] [place], [place], [address] Phone: [phone]   Fax:  [phone]  Name: Natasha Murphy MRN: [id number] Date of Birth: Jun 27, 1970

## 2016-12-26 ENCOUNTER — Ambulatory Visit (INDEPENDENT_AMBULATORY_CARE_PROVIDER_SITE_OTHER): Payer: 59 | Admitting: Physical Therapy

## 2016-12-26 ENCOUNTER — Encounter: Payer: Self-pay | Admitting: Physical Therapy

## 2016-12-26 DIAGNOSIS — M542 Cervicalgia: Secondary | ICD-10-CM

## 2016-12-26 NOTE — Therapy (Signed)
[facility] [address] [place], [place], [address] Phone: [phone]   Fax:  [phone]  Physical Therapy Treatment  Patient Details  Name: Natasha Murphy MRN: [id number] Date of Birth: 12-Jun-1970 Referring Provider: Gaspar Bidding  Encounter Date: 12/26/2016      PT End of Session - 12/26/16 1508    Visit Number 3   Number of Visits 12   Date for PT Re-Evaluation 01/23/17   PT Start Time 1305   PT Stop Time 1358   PT Time Calculation (min) 53 min   Activity Tolerance Patient tolerated treatment well   Behavior During Therapy [facility] for tasks assessed/performed      Past Medical History:  Diagnosis Date  . HSV-2 seropositive   . Hx of chlamydia infection 1995, 01/2009  . Hx of gonorrhea 1995  . Lactose intolerance   . Varicose veins   . Vitamin D deficiency     Past Surgical History:  Procedure Laterality Date  . CESAREAN SECTION WITH BILATERAL TUBAL LIGATION    . UMBILICAL HERNIA REPAIR  age 4    There were no vitals filed for this visit.      Subjective Assessment - 12/26/16 1505    Subjective Pt states tape helped to relax UT muscle. 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    Currently in Pain? Yes   Pain Score 2    Pain Location Neck   Pain Orientation Left   Pain Descriptors / Indicators Tightness   Pain Type Acute pain                         [facility] Adult PT Treatment/Exercise - 12/26/16 1302      Neck Exercises: Theraband   Scapula Retraction 20 reps     Neck Exercises: Seated   Neck Retraction 20 reps   Shoulder Rolls 20 reps     Modalities   Modalities Moist Heat     Moist Heat Therapy   Number Minutes Moist Heat 10 Minutes   Moist Heat Location Cervical     Manual Therapy   Manual Therapy Joint mobilization;Soft tissue mobilization;Passive ROM;Neural Stretch;Taping   Joint Mobilization manual distraction, PA mobs, side  glides   Soft tissue mobilization L upper trap   Passive ROM  upper trap stretching   Neural Stretch UE nerve glides/supine/manual   Kinesiotex --     Kinesiotix   Inhibit Muscle  Left Upper trap inhibition// and 2 I strips for postural NMR     Neck Exercises: Stretches   Upper Trapezius Stretch 3 reps;30 seconds   Levator Stretch 3 reps;30 seconds                PT Education - 12/26/16 1507    Education provided Yes   Education Details HEP review, posture review   Person(s) Educated Patient   Methods Explanation   Comprehension Verbalized understanding;Need further instruction          PT Short Term Goals - 12/12/16 1039      PT SHORT TERM GOAL #1   Title Pt to demo decreased pain in neck, to 2/10 with work activities    Time 2   Period Weeks   Status New   Target Date 12/26/16           PT Long Term Goals - 12/12/16 1039      PT LONG TERM GOAL #1  Title Pt to demo decreased pain in neck, to 0-1/10 with sitting work duties   Time 6   Period Weeks   Status New   Target Date 01/23/17     PT LONG TERM GOAL #2   Title Pt to demo improved cervical ROM to be WNL and pain free, to improve ability for ADLS and work duties    Time 6   Period Weeks   Status New   Target Date 01/23/17     PT LONG TERM GOAL #3   Title Pt to demo ability for full L shoulder AROM without increased pain, to improve abiity for IADLs and reaching, lifting, carrying.    Time 6   Period Weeks   Status New   Target Date 01/23/17               Plan - 12/26/16 1509    Clinical Impression Statement Pt treated with manual therapy for improving cervical ROM, tightness, and pain. Taping done for postural cuing as well as upper trap inhibition. Pt with increased soreness and tightness in L upper trap , and will benefit from continued work on this.    Rehab Potential Good   PT Frequency 2x / week   PT Duration 6 weeks   PT Next Visit Plan Manual, nerve glides, ther ex for  cervical tightness and mobility    PT Home Exercise Plan Upper trap stretch; scap squeezes, shoulder rolls.    Consulted and Agree with Plan of Care Patient      Patient will benefit from skilled therapeutic intervention in order to improve the following deficits and impairments:  Hypomobility, Decreased strength, Impaired UE functional use, Pain, Increased muscle spasms, Decreased range of motion, Improper body mechanics  Visit Diagnosis: Cervicalgia     Problem List Patient Active Problem List   Diagnosis Date Noted  . 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 Osteoarthritis of spine 10/30/2016  . Vitamin D deficiency 10/02/2016  . Pain in both knees 09/12/2016   Sedalia Muta, PT, DPT 3:14 PM  12/26/16    [facility] [address] [place], [place], [address] Phone: [phone]   Fax:  [phone]  Name: Natasha Murphy MRN: [id number] Date of Birth: July 05, 1970

## 2016-12-27 ENCOUNTER — Telehealth: Payer: Self-pay | Admitting: Sports Medicine

## 2016-12-27 NOTE — Telephone Encounter (Signed)
Paperwork: FMLA   Paperwork received by [employer] requesting form]: Received by [employer]   Individual made aware of 3-5 business day turn around (Y/N): yes   Office form(s) completed and placed with paperwork (Y/N): yes   Form location: [employer]

## 2016-12-28 NOTE — Telephone Encounter (Signed)
Called and left VM (ok per DPR) advising that form will be ready for pick up on Monday.

## 2016-12-28 NOTE — Telephone Encounter (Signed)
Forms ready for pick up

## 2017-01-02 ENCOUNTER — Ambulatory Visit (HOSPITAL_COMMUNITY)
Admission: RE | Admit: 2017-01-02 | Discharge: 2017-01-02 | Disposition: A | Discharge status: Home / Self Care | Payer: 59 | Source: Ambulatory Visit | Attending: Vascular Surgery | Admitting: Vascular Surgery

## 2017-01-02 ENCOUNTER — Ambulatory Visit (INDEPENDENT_AMBULATORY_CARE_PROVIDER_SITE_OTHER): Payer: 59 | Admitting: Vascular Surgery

## 2017-01-02 ENCOUNTER — Ambulatory Visit (INDEPENDENT_AMBULATORY_CARE_PROVIDER_SITE_OTHER): Payer: 59 | Admitting: Physical Therapy

## 2017-01-02 ENCOUNTER — Encounter: Payer: Self-pay | Admitting: Vascular Surgery

## 2017-01-02 ENCOUNTER — Encounter: Payer: Self-pay | Admitting: Physical Therapy

## 2017-01-02 VITALS — BP 119/77 | HR 91 | Temp 97.1°F | Ht 67.0 in | Wt 210.0 lb

## 2017-01-02 DIAGNOSIS — I872 Venous insufficiency (chronic) (peripheral): Secondary | ICD-10-CM | POA: Diagnosis not present

## 2017-01-02 DIAGNOSIS — M542 Cervicalgia: Secondary | ICD-10-CM | POA: Diagnosis not present

## 2017-01-02 DIAGNOSIS — I83813 Varicose veins of bilateral lower extremities with pain: Secondary | ICD-10-CM | POA: Diagnosis present

## 2017-01-02 NOTE — Therapy (Signed)
[facility] [address] [place], [place], [address] Phone: [phone]   Fax:  [phone]  Physical Therapy Treatment  Patient Details  Name: Natasha Murphy MRN: [id number] Date of Birth: 1970/07/23 Referring Provider: Gaspar Bidding  Encounter Date: 01/02/2017      PT End of Session - 01/02/17 1523    Visit Number 4   Number of Visits 12   Date for PT Re-Evaluation 01/23/17   PT Start Time 1432   PT Stop Time 1528   PT Time Calculation (min) 56 min   Activity Tolerance Patient tolerated treatment well   Behavior During Therapy [facility] for tasks assessed/performed      Past Medical History:  Diagnosis Date  . HSV-2 seropositive   . Hx of chlamydia infection 1995, 01/2009  . Hx of gonorrhea 1995  . Lactose intolerance   . Varicose veins   . Vitamin D deficiency     Past Surgical History:  Procedure Laterality Date  . CESAREAN SECTION WITH BILATERAL TUBAL LIGATION    . UMBILICAL HERNIA REPAIR  age 46    There were no vitals filed for this visit.      Subjective Assessment - 01/02/17 1432    Subjective Pt states that she is having less pain and tightness.    Currently in Pain? No/denies   Pain Score 0-No pain                         [facility] Adult PT Treatment/Exercise - 01/02/17 1433      Neck Exercises: Theraband   Scapula Retraction 20 reps   Scapula Retraction Limitations Red t-band      Neck Exercises: Standing   Other Standing Exercises --     Neck Exercises: Seated   Neck Retraction 20 reps   Shoulder Rolls --     Neck Exercises: Supine   Shoulder Flexion 20 reps   Shoulder Flexion Limitations AROM      Modalities   Modalities Moist Heat     Moist Heat Therapy   Number Minutes Moist Heat 10 Minutes   Moist Heat Location Cervical     Manual Therapy   Manual Therapy Joint mobilization;Soft tissue mobilization;Passive ROM;Neural Stretch;Taping   Joint Mobilization manual  distraction, PA mobs,    Soft tissue mobilization L upper trap IASTM   Passive ROM  upper trap stretching   Neural Stretch UE nerve glides/supine/manual     Kinesiotix   Inhibit Muscle  Left Upper trap inhibition// and 2 I strips for postural NMR     Neck Exercises: Stretches   Upper Trapezius Stretch 3 reps;30 seconds   Levator Stretch 3 reps;30 seconds   Other Neck Stretches 1 UE pec stretch w nerve glide in doorway 30 sec x2                PT Education - 01/02/17 1518    Education provided Yes   Education Details HEP, posture at work    [employer]) Educated Patient   Methods Explanation   Comprehension Verbalized understanding;Need further instruction          PT Short Term Goals - 01/02/17 1524      PT SHORT TERM GOAL #1   Title Pt to demo decreased pain in neck, to 2/10 with work activities    Time 2   Period Weeks   Status Achieved           PT Long Term Goals -  12/12/16 1039      PT LONG TERM GOAL #1   Title Pt to demo decreased pain in neck, to 0-1/10 with sitting work duties   Time 6   Period Weeks   Status New   Target Date 01/23/17     PT LONG TERM GOAL #2   Title Pt to demo improved cervical ROM to be WNL and pain free, to improve ability for ADLS and work duties    Time 6   Period Weeks   Status New   Target Date 01/23/17     PT LONG TERM GOAL #3   Title Pt to demo ability for full L shoulder AROM without increased pain, to improve abiity for IADLs and reaching, lifting, carrying.    Time 6   Period Weeks   Status New   Target Date 01/23/17               Plan - 01/02/17 1518    Clinical Impression Statement Pt with improving tightness and pain at L upper trap region. She does have mild soreness and does have trigger points in L upper trap, addressed with manual therapy, ther ex, and taping today, but trigger points improved from previous sessions. Plan to progress light strengthenig for posture as pt tolerates, and as pain  decreases.    Rehab Potential Good   PT Frequency 2x / week   PT Duration 6 weeks   PT Next Visit Plan Manual, nerve glides, ther ex for cervical tightness and mobility    PT Home Exercise Plan Upper trap stretch; scap squeezes, shoulder rolls;  Pec stretch at wall w nerve glide;    Consulted and Agree with Plan of Care Patient      Patient will benefit from skilled therapeutic intervention in order to improve the following deficits and impairments:  Hypomobility, Decreased strength, Impaired UE functional use, Pain, Increased muscle spasms, Decreased range of motion, Improper body mechanics  Visit Diagnosis: Cervicalgia     Problem List Patient Active Problem List   Diagnosis Date Noted  . 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 Osteoarthritis of spine 10/30/2016  . Vitamin D deficiency 10/02/2016  . Pain in both knees 09/12/2016   Sedalia Muta, PT, DPT 3:28 PM  01/02/17    [facility] [address] [place], [place], [address] Phone: [phone]   Fax:  [phone]  Name: Natasha Murphy MRN: [id number] Date of Birth: 1971-03-12

## 2017-01-02 NOTE — Progress Notes (Signed)
Patient name: Natasha Murphy MRN: [id number] DOB: 19-Nov-1970 Sex: female   REASON FOR CONSULT:    Varicose veins right lower extremity. The consult is requested by Dr. Berline Chough  HPI:   Natasha Murphy is a pleasant 46 y.o. female,  Who is referred for evaluation of varicose veins of the right lower extremity. She's had varicose veins for many years in the medial right calf but recently noticed that he's had been getting bigger. She has 3 children and the varicose veins got a little bigger each time. She does describe some swelling in her ankles which is worsened by sitting and standing. She does not describe significant leg pain or heaviness. She is unaware of any history of DVT or phlebitis.  Past Medical History:  Diagnosis Date  . HSV-2 seropositive   . Hx of chlamydia infection 1995, 01/2009  . Hx of gonorrhea 1995  . Lactose intolerance   . Varicose veins   . Vitamin D deficiency     Family History  Problem Relation Age of Onset  . Hypertension Mother   . Depression Mother   . Cancer Paternal Aunt   . Cancer Paternal Grandmother     SOCIAL HISTORY: Social History   Social History  . Marital status: Married    Spouse name: N/A  . Number of children: N/A  . Years of education: N/A   Occupational History  . Not on file.   Social History Main Topics  . Smoking status: Never Smoker  . Smokeless tobacco: Never Used  . Alcohol use Yes     Comment: occassional (twice/month)  . Drug use: No  . Sexual activity: Yes   Other Topics Concern  . Not on file   Social History Narrative  . No narrative on file    Allergies  Allergen Reactions  . Tramadol Shortness Of Breath  . Penicillins Hives    Current Outpatient Prescriptions  Medication Sig Dispense Refill  . acetaminophen (TYLENOL) 500 MG tablet Take 500-1,000 mg by mouth every 6 (six) hours as needed for moderate pain.    Marland Kitchen Alum & Mag Hydroxide-Simeth (ANTACID ANTI-GAS PO) Take 1 tablet by mouth daily as  needed (heartburn).    . cetirizine (ZYRTEC ALLERGY) 10 MG tablet Take 10 mg by mouth daily as needed for allergies.     . Cholecalciferol (VITAMIN D) 2000 units CAPS Take 1 capsule by mouth daily.    . Cholecalciferol 50000 units TABS 50,000 units PO qwk for 8 weeks. 8 tablet 0  . Cyanocobalamin (VITAMIN B-12 CR PO) Take 1 tablet by mouth daily.     Marland Kitchen gabapentin (NEURONTIN) 100 MG capsule Start with 1 tab po qhs X 1 week, then increase to 1 tab po bid X 1 week then 1 tab po tid prn 90 capsule 1   No current facility-administered medications for this visit.     REVIEW OF SYSTEMS:   denotes positive finding,  denotes negative finding Cardiac  Comments:  Chest pain or chest pressure:    Shortness of breath upon exertion:    Short of breath when lying flat:    Irregular heart rhythm:        Vascular    Pain in calf, thigh, or hip brought on by ambulation:    Pain in feet at night that wakes you up from your sleep:     Blood clot in your veins:    Leg swelling:  Pulmonary    Oxygen at home:    Productive cough:     Wheezing:         Neurologic    Sudden weakness in arms or legs:     Sudden numbness in arms or legs:     Sudden onset of difficulty speaking or slurred speech:    Temporary loss of vision in one eye:     Problems with dizziness:         Gastrointestinal    Blood in stool:     Vomited blood:         Genitourinary    Burning when urinating:     Blood in urine:        Psychiatric    Major depression:         Hematologic    Bleeding problems:    Problems with blood clotting too easily:        Skin    Rashes or ulcers:        Constitutional    Fever or chills:     PHYSICAL EXAM:   Vitals:   01/02/17 1251  BP: 119/77  Pulse: 91  Temp: (!) 97.1 F (36.2 C)  TempSrc: Oral  SpO2: 99%  Weight: 210 lb (95.3 kg)  Height:  (1.702 m)    GENERAL: The patient is a well-nourished female, in no acute distress. The vital signs are  documented above. CARDIAC: There is a regular rate and rhythm.  VASCULAR: I do not detect carotid bruits. She has palpable posterior tibial pulses bilaterally. She has no significant lower extremity swelling. She has a cluster of varicose veins in her medial proximal right calf. PULMONARY: There is good air exchange bilaterally without wheezing or rales. ABDOMEN: Soft and non-tender with normal pitched bowel sounds.  MUSCULOSKELETAL: There are no major deformities or cyanosis. NEUROLOGIC: No focal weakness or paresthesias are detected. SKIN: There are no ulcers or rashes noted. PSYCHIATRIC: The patient has a normal affect.  DATA:    RIGHT LOWER EXTREMITY VENOUS DUPLEX: I have independently interpreted his right lower extremity venous duplex scan. There is no evidence of DVT or superficial thrombophlebitis in the right lower extremity. There is deep venous reflux involving the common femoral vein. There is superficial venous reflux involving the right great saphenous vein and saphenofemoral junction. There is also reflux in the small saphenous vein on the right.  MEDICAL ISSUES:   CHRONIC VENOUS INSUFFICIENCY: Based on her exam and duplex findings she has significant chronic venous insufficiency of the right lower extremity. We have discussed the importance of intermittent leg elevation in the proper positioning for this. In addition I have given her a prescription for knee-high compression stockings with a gradient of 15-20 mmHg. I have encouraged her to stay as active as possible and to exercise. We've also discussed water aerobics which is very helpful for people with chronic venous insufficiency. I have encouraged her to avoid prolonged sitting and standing. If her symptoms progressed and certainly we could try her in thigh-high compression stockings with a gradient of 20-30 mmHg. If this were not successful, she could potentially be considered for endovenous laser ablation of the great  saphenous vein and/or small saphenous vein in the future. I will see her back as needed.  Waverly Ferrari [facility] [phone]

## 2017-01-16 ENCOUNTER — Encounter: Payer: Self-pay | Admitting: Sports Medicine

## 2017-01-16 ENCOUNTER — Ambulatory Visit (INDEPENDENT_AMBULATORY_CARE_PROVIDER_SITE_OTHER): Payer: 59 | Admitting: Physical Therapy

## 2017-01-16 ENCOUNTER — Ambulatory Visit (INDEPENDENT_AMBULATORY_CARE_PROVIDER_SITE_OTHER): Payer: 59 | Admitting: Sports Medicine

## 2017-01-16 VITALS — BP 120/78 | HR 73 | Ht 67.0 in | Wt 208.4 lb

## 2017-01-16 DIAGNOSIS — Q7649 Other congenital malformations of spine, not associated with scoliosis: Secondary | ICD-10-CM

## 2017-01-16 DIAGNOSIS — M542 Cervicalgia: Secondary | ICD-10-CM

## 2017-01-16 DIAGNOSIS — M76812 Anterior tibial syndrome, left leg: Secondary | ICD-10-CM

## 2017-01-16 DIAGNOSIS — M79672 Pain in left foot: Secondary | ICD-10-CM | POA: Diagnosis not present

## 2017-01-16 DIAGNOSIS — M4722 Other spondylosis with radiculopathy, cervical region: Secondary | ICD-10-CM | POA: Diagnosis not present

## 2017-01-16 MED ORDER — DICLOFENAC SODIUM 2 % TD SOLN: 1.0000 "application " | Freq: Two times a day (BID) | TRANSDERMAL | 0 refills | Status: AC

## 2017-01-16 MED ORDER — DICLOFENAC SODIUM 2 % TD SOLN: 1.0000 "application " | Freq: Two times a day (BID) | TRANSDERMAL | 2 refills | Status: DC

## 2017-01-16 NOTE — Therapy (Signed)
[facility] [address] [place], [place], [address] Phone: [phone]   Fax:  [phone]  Physical Therapy Treatment  Patient Details  Name: Natasha Murphy MRN: [id number] Date of Birth: 07/25/1970 Referring Provider: Gaspar Bidding  Encounter Date: 01/16/2017      PT End of Session - 01/16/17 0857    Visit Number 5   Number of Visits 12   Date for PT Re-Evaluation 01/23/17   PT Start Time 0806   PT Stop Time 0848   PT Time Calculation (min) 42 min   Activity Tolerance Patient tolerated treatment well   Behavior During Therapy [facility] for tasks assessed/performed      Past Medical History:  Diagnosis Date  . HSV-2 seropositive   . Hx of chlamydia infection 1995, 01/2009  . Hx of gonorrhea 1995  . Lactose intolerance   . Varicose veins   . Vitamin D deficiency     Past Surgical History:  Procedure Laterality Date  . CESAREAN SECTION WITH BILATERAL TUBAL LIGATION    . UMBILICAL HERNIA REPAIR  age 88    There were no vitals filed for this visit.      Subjective Assessment - 01/16/17 0808    Subjective doing much better.  following manual last time pain then seemed to move to thoracic spine, but now resovled.  still having some pain but overall much better.   Pertinent History Pt states previous history of neck pain, mostly increased with work duties, that has come and gone, and usually resolves itself. She has had recent flare up of pain, with no incident to report. She does sit at desk for work, does mostly computer and phone work. She denies any numbness/tingling, but has increased pain in L side of neck .    Patient Stated Goals Decreased pain, working/sitting without pain.    Pain Score 0-No pain  up to 5/10   Pain Location Neck   Pain Orientation Left   Pain Descriptors / Indicators Tightness   Pain Type Acute pain   Pain Onset More than a month ago   Pain Frequency Intermittent   Aggravating Factors  stress,  sitting                         [facility] Adult PT Treatment/Exercise - 01/16/17 0001      Neck Exercises: Theraband   Scapula Retraction 20 reps;Red   Scapula Retraction Limitations standing   Shoulder External Rotation 10 reps;Red   Shoulder External Rotation Limitations supine   Horizontal ABduction 10 reps;Red   Horizontal ABduction Limitations supine     Neck Exercises: Seated   Shoulder Rolls 10 reps;Backwards   Other Seated Exercise scapular retraction x 10 reps; instructed in longer hold while at work sitting at desk     Neck Exercises: Supine   Neck Retraction 10 reps;5 secs   Capital Flexion 10 reps;5 secs     Manual Therapy   Manual Therapy Myofascial release   Soft tissue mobilization Lt upper trap   Myofascial Release Lt upper trap     Neck Exercises: Stretches   Upper Trapezius Stretch 2 reps;30 seconds                PT Education - 01/16/17 0857    Education provided Yes   Education Details postural HEP, DN   Person(s) Educated Patient   Methods Explanation   Comprehension Verbalized understanding  PT Short Term Goals - 01/02/17 1524      PT SHORT TERM GOAL #1   Title Pt to demo decreased pain in neck, to 2/10 with work activities    Time 2   Period Weeks   Status Achieved           PT Long Term Goals - 12/12/16 1039      PT LONG TERM GOAL #1   Title Pt to demo decreased pain in neck, to 0-1/10 with sitting work duties   Time 6   Period Weeks   Status New   Target Date 01/23/17     PT LONG TERM GOAL #2   Title Pt to demo improved cervical ROM to be WNL and pain free, to improve ability for ADLS and work duties    Time 6   Period Weeks   Status New   Target Date 01/23/17     PT LONG TERM GOAL #3   Title Pt to demo ability for full L shoulder AROM without increased pain, to improve abiity for IADLs and reaching, lifting, carrying.    Time 6   Period Weeks   Status New   Target Date 01/23/17                Plan - 01/16/17 0857    Clinical Impression Statement Pt reports overall improved symptoms, and able to progress HEP today to include postural retraining and strengthening exercises without increase in pain.  Pt still has active trigger points in UT and may benefit from DN.  Provided handout today to pt to review and consider for next session.   PT Treatment/Interventions ADLs/Self Care Home Management;Electrical Stimulation;Moist Heat;Ultrasound;Neuromuscular re-education;Therapeutic activities;Therapeutic exercise;Patient/family education;Manual techniques;Passive range of motion;Taping;Dry needling   PT Next Visit Plan DN, manual, modalities, review HEP, will need renewal   PT Home Exercise Plan Upper trap stretch; scap squeezes, shoulder rolls;  Pec stretch at wall w nerve glide;    Consulted and Agree with Plan of Care Patient      Patient will benefit from skilled therapeutic intervention in order to improve the following deficits and impairments:  Hypomobility, Decreased strength, Impaired UE functional use, Pain, Increased muscle spasms, Decreased range of motion, Improper body mechanics  Visit Diagnosis: Cervicalgia     Problem List Patient Active Problem List   Diagnosis Date Noted  . 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 Osteoarthritis of spine 10/30/2016  . Vitamin D deficiency 10/02/2016  . Pain in both knees 09/12/2016      Natasha Murphy, PT, DPT 01/16/17 9:00 AM    [facility] [address] [place], [place], [address] Phone: [phone]   Fax:  [phone]  Name: Natasha Murphy MRN: [id number] Date of Birth: [birth year]

## 2017-01-16 NOTE — Progress Notes (Signed)
OFFICE VISIT NOTE Natasha Murphy. Nat[facility] Murphy [facility] Natasha Murphy [phone]  Natasha Murphy - 46 y.o. female MRN [id number]  Date of birth: 10/20/1970  Visit Date: 01/16/2017  PCP: Natasha Rima, DO   Referred by: Natasha Rima, DO  Natasha Murphy, CMA  SUBJECTIVE:   Chief Complaint  Patient presents with  . Follow-up    C-spine pain   HPI: As below and per problem based documentation when appropriate.  [name] is an established pt presenting today for f/u of cervical pain. She was last seen 12/05/16 and has been doing PT.   Pt reports improvement in pain since started PT. She feels that there isn't as much tightness and irritation in the neck. She still has tenderness to palpation. She has discussed dry needling with Natasha Murphy (PT) and feels this may be beneficial. She has been working on posture and stretches while sitting but she does still have some discomfort while working. She has no neck pain while sleeping.   Pt c/o pain and tingling on the medial aspect of her LT foot. She broke a foot a few years ago when it was caught in a car door.     Review of Systems  Constitutional: Negative.   Respiratory: Negative.   Cardiovascular: Negative.   Musculoskeletal: Positive for neck pain.  Neurological: Positive for tingling. Negative for dizziness and headaches.    Otherwise per HPI.  HISTORY & PERTINENT PRIOR DATA:  No specialty comments available. She reports that she has never smoked. She has never used smokeless tobacco. No results for input(s): HGBA1C, LABURIC in the last 8760 hours. Allergies reviewed per EMR Prior to Admission medications   Medication Sig Start Date End Date Taking? Authorizing Provider  acetaminophen (TYLENOL) 500 MG tablet Take 500-1,000 mg by mouth every 6 (six) hours as needed for moderate pain.   Yes [provider]  Alum & Mag Hydroxide-Simeth (ANTACID ANTI-GAS PO) Take 1 tablet by mouth  daily as needed (heartburn).   Yes [provider]  cetirizine (ZYRTEC ALLERGY) 10 MG tablet Take 10 mg by mouth daily as needed for allergies.    Yes [provider]  Cholecalciferol (VITAMIN D) 2000 units CAPS Take 1 capsule by mouth daily.   Yes [provider]  Cholecalciferol 50000 units TABS 50,000 units PO qwk for 8 weeks. 12/17/16  Yes Natasha Rima, DO  Cyanocobalamin (VITAMIN B-12 CR PO) Take 1 tablet by mouth daily.    Yes [provider]  Diclofenac Sodium (PENNSAID) 2 % SOLN Place 1 application onto the skin 2 (two) times daily. 01/16/17   Natasha Mews, DO  Diclofenac Sodium (PENNSAID) 2 % SOLN Place 1 application onto the skin 2 (two) times daily. 01/16/17 01/17/17  Natasha Mews, DO   Patient Active Problem List   Diagnosis Date Noted  . Anterior tibialis tendinitis of left leg 01/16/2017  . Osteoarthritis of spine with radiculopathy, cervical region 01/16/2017  . 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 Osteoarthritis of spine 10/30/2016  . Vitamin D deficiency 10/02/2016  . Pain in both knees 09/12/2016   Past Medical History:  Diagnosis Date  . HSV-2 seropositive   . Hx of chlamydia infection 1995, 01/2009  . Hx of gonorrhea 1995  . Lactose intolerance   . Varicose veins   . Vitamin D deficiency    Family History  Problem Relation Age of Onset  . Hypertension Mother   . Depression Mother   .  Cancer Paternal Aunt   . Cancer Paternal Grandmother    Past Surgical History:  Procedure Laterality Date  . CESAREAN SECTION WITH BILATERAL TUBAL LIGATION    . UMBILICAL HERNIA REPAIR  age 30   Social History   Occupational History  . Not on file.   Social History Main Topics  . Smoking status: Never Smoker  . Smokeless tobacco: Never Used  . Alcohol use Yes     Comment: occassional (twice/month)  . Drug use: No  . Sexual activity: Yes    OBJECTIVE:  VS:  HT:5\' 7"  (170.2 cm)   WT:208 lb 6.4 oz (94.5 kg)   BMI:32.63    BP:120/78  HR:73bpm  TEMP: ( )  RESP:98 % EXAM: Findings:  Neck is overall well aligned.  She has limited cervical sidebending and rotation to the right.  Pain with Spurling's compression test to the right into the left.  Pain with brachial plexus squeeze on the left.  No significant pain with arm squeeze.  Upper extremity motor strength is 5 out of 5.  Sensation in the upper extremities is intact light touch.  Negative Hoffmann's.  Left ankle is overall slightly swollen on the anterior medial aspects.  There is no limitations in range of motion.  She has a moderately high arch that is overall maintained with weightbearing on the left.  She does have some collapse on the right with type I posterior tibialis dysfunction on the right, DP PT pulses are 2+/4.  Pain is worse with resisted ankle dorsiflexion.  No pain across the anterior joint line.  Stable to ankle drawer testing and talar tilt.    RADIOLOGY: MR Cervical Spine Wo Contrast CLINICAL DATA:  Neck pain and stiffness for 4 years. No known injury.  EXAM: MRI CERVICAL SPINE WITHOUT CONTRAST  TECHNIQUE: Multiplanar, multisequence MR imaging of the cervical spine was performed. No intravenous contrast was administered.  COMPARISON:  Plain film cervical spine 10/30/2016.  FINDINGS: Alignment: There is straightening of the normal cervical lordosis.  Vertebrae: No fracture or worrisome lesion. There is partial congenital fusion across the C2-3 disc interspace and the facet joints are fused consistent with Klippel-Feil deformity.  Cord: Normal signal throughout.  Posterior Fossa, vertebral arteries, paraspinal tissues: Negative.  Disc levels:  C2-3: Congenitally fused. The central canal and foramina are widely patent.  C3-4: Shallow left paracentral protrusion without central canal or foraminal stenosis.  C4-5: There is some posterior bony ridging and uncovertebral disease. The ventral thecal sac is effaced.  The right foramen is open. Mild left foraminal narrowing is noted.  C5-6: Disc osteophyte complex and uncovertebral disease are seen. The ventral thecal sac is effaced and there is mild flattening of the right cord. The foramina are open.  C6-7: Shallow disc bulge and uncovertebral disease are identified. The central canal is open. Mild foraminal narrowing is more notable on the left.  C7-T1:  Mild facet degenerative change.  Otherwise negative.  T1-2:  Negative.  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 Please correlate with plain films if any intervention is planned.  Disc osteophyte complex at C4-5 effaces the ventral thecal sac. There is also mild left foraminal narrowing at this level.  Disc osteophyte complex eccentric to the right at C5-6 mildly deform the right hemicord. The foramina are open.  Electronically Signed   By: Drusilla Kanner M.D.   On: 11/21/2016 10:41  ASSESSMENT & PLAN:     ICD-10-CM   1. Left foot pain M79.672 Diclofenac  Sodium (PENNSAID) 2 % SOLN    Diclofenac Sodium (PENNSAID) 2 % SOLN  2. Anterior tibialis tendinitis of left leg M76.812   3. Osteoarthritis of spine with radiculopathy, cervical region M47.22   4. Neck pain M54.2   5. Congenital anomaly of cervical spine Q76.49    ================================================================= Anterior tibialis tendinitis of left leg Patient has focal tenderness over the anterior portion of the left ankle directly over the anterior tibialis insertion.  We will have her begin with topical anti-inflammatories improved arch support.  If any lack of improvement consider further diagnostic evaluation.  Congenital anomaly of cervical spine Overall making good progress, continue with physical therapy.  Osteoarthritis of spine with radiculopathy, cervical region Overall progressing well with therapy, will consider addition of dry needling with physical therapy but  no further indication for intervention at this time.  She is sleeping significantly better.  No focal weakness.  She will continue to stay off of the gabapentin unles worsening features and she will call if having problems.  Otherwise we will plan to see her in 6 weeks.  No notes on file ================================================================= There are no Patient Instructions on file for this visit. ================================================================= Future Appointments Date Time Provider Department Murphy  01/23/2017 4:00 PM [facility] SUB THERAPIST A [facility] None  02/27/2017 9:00 AM Natasha Mews, DO [facility] None    Follow-up: Return in about 6 weeks (around 02/27/2017).   CMA/ATC served as Neurosurgeon during this visit. History, Physical, and Plan performed by medical provider. Documentation and orders reviewed and attested to.      Gaspar Bidding, DO    [facility] Phys[facility]

## 2017-01-16 NOTE — Assessment & Plan Note (Signed)
Overall making good progress, continue with physical therapy.

## 2017-01-16 NOTE — Patient Instructions (Signed)
Supine Push    Take a deep breath and exhale while pushing the back of neck down on the bed. Hold __5__ seconds. Repeat __10__ times. Do __1-2__ sessions per day.   Flexors, Supine    Lie on back, head on small, rolled towel. Tip chin down. Tighten muscles in back of throat. Hold __5_ seconds. Repeat _10__ times per session. Do _1-2__ sessions per day.  Resisted Horizontal Abduction: Bilateral    Lying down, tubing in both hands, arms out in front. Keeping arms straight, pinch shoulder blades together and stretch arms out. Repeat __10__ times per set. Do __1__ sets per session. Do __1-2__ sessions per day.   Resisted External Rotation: in Neutral - Bilateral    Lying down, tubing in both hands, elbows at sides, bent to 90, forearms forward. Pinch shoulder blades together and rotate forearms out. Keep elbows at sides. Repeat __10__ times per set. Do __1__ sets per session. Do _1-2___ sessions per day.   Trigger Point Dry Needling  . What is Trigger Point Dry Needling (DN)? o DN is a physical therapy technique used to treat muscle pain and dysfunction. Specifically, DN helps deactivate muscle trigger points (muscle knots).  o A thin filiform needle is used to penetrate the skin and stimulate the underlying trigger point. The goal is for a local twitch response (LTR) to occur and for the trigger point to relax. No medication of any kind is injected during the procedure.   . What Does Trigger Point Dry Needling Feel Like?  o The procedure feels different for each individual patient. Some patients report that they do not actually feel the needle enter the skin and overall the process is not painful. Very mild bleeding may occur. However, many patients feel a deep cramping in the muscle in which the needle was inserted. This is the local twitch response.   Marland Kitchen How Will I feel after the treatment? o Soreness is normal, and the onset of soreness may not occur for a few hours.  Typically this soreness does not last longer than two days.  o Bruising is uncommon, however; ice can be used to decrease any possible bruising.  o In rare cases feeling tired or nauseous after the treatment is normal. In addition, your symptoms may get worse before they get better, this period will typically not last longer than 24 hours.   . What Can I do After My Treatment? o Increase your hydration by drinking more water for the next 24 hours. o You may place ice or heat on the areas treated that have become sore, however, do not use heat on inflamed or bruised areas. Heat often brings more relief post needling. o You can continue your regular activities, but vigorous activity is not recommended initially after the treatment for 24 hours. o DN is best combined with other physical therapy such as strengthening, stretching, and other therapies.

## 2017-01-16 NOTE — Assessment & Plan Note (Signed)
Overall progressing well with therapy, will consider addition of dry needling with physical therapy but no further indication for intervention at this time.  She is sleeping significantly better.  No focal weakness.  She will continue to stay off of the gabapentin unles worsening features and she will call if having problems.  Otherwise we will plan to see her in 6 weeks.

## 2017-01-16 NOTE — Assessment & Plan Note (Signed)
Patient has focal tenderness over the anterior portion of the left ankle directly over the anterior tibialis insertion.  We will have her begin with topical anti-inflammatories improved arch support.  If any lack of improvement consider further diagnostic evaluation.

## 2017-01-23 ENCOUNTER — Ambulatory Visit (INDEPENDENT_AMBULATORY_CARE_PROVIDER_SITE_OTHER): Payer: 59 | Admitting: Physical Therapy

## 2017-01-23 DIAGNOSIS — M542 Cervicalgia: Secondary | ICD-10-CM

## 2017-01-23 NOTE — Therapy (Signed)
[facility] [address] [place], [place], [address] Phone: [phone]   Fax:  [phone]  Physical Therapy Treatment/Recertification  Patient Details  Name: Natasha Murphy MRN: [id number] Date of Birth: Sep 07, 1970 Referring Provider: Teresa Coombs  Encounter Date: 01/23/2017      PT End of Session - 01/23/17 1643    Visit Number 6   Number of Visits 12   Date for PT Re-Evaluation 03/06/17   PT Start Time 1602   PT Stop Time 1650   PT Time Calculation (min) 48 min   Activity Tolerance Patient tolerated treatment well   Behavior During Therapy [facility] for tasks assessed/performed      Past Medical History:  Diagnosis Date  . HSV-2 seropositive   . Hx of chlamydia infection 1995, 01/2009  . Hx of gonorrhea 1995  . Lactose intolerance   . Varicose veins   . Vitamin D deficiency     Past Surgical History:  Procedure Laterality Date  . CESAREAN SECTION WITH BILATERAL TUBAL LIGATION    . UMBILICAL HERNIA REPAIR  age 46    There were no vitals filed for this visit.      Subjective Assessment - 01/23/17 1604    Subjective doing pretty well, no huge difference.  unsure about the DN. still having some tightness in Lt neck/shoulder.  pain up to 5/10 with seated work activities.     Patient Stated Goals Decreased pain, working/sitting without pain.             [facility] PT Assessment - 01/23/17 1612      AROM   Overall AROM Comments no pain with cervical ROM; still with mild Lt rotation and sidebending limitations; no pain with shoulder ROM                     [facility] Adult PT Treatment/Exercise - 01/23/17 1642      Moist Heat Therapy   Number Minutes Moist Heat 10 Minutes   Moist Heat Location Cervical     Electrical Stimulation   Electrical Stimulation Location Lt upper trap   Electrical Stimulation Action premod x 10 min   Electrical Stimulation Parameters to tolerance   Electrical Stimulation Goals  Pain;Tone     Manual Therapy   Manual Therapy Soft tissue mobilization;Myofascial release   Soft tissue mobilization Lt upper trap   Myofascial Release Lt upper trap          Trigger Point Dry Needling - 01/23/17 1642    Consent Given? Yes   Education Handout Provided Yes   Muscles Treated Upper Body Upper trapezius   Upper Trapezius Response Twitch reponse elicited;Palpable increased muscle length                PT Short Term Goals - 01/02/17 1524      PT SHORT TERM GOAL #1   Title Pt to demo decreased pain in neck, to 2/10 with work activities    Time 2   Period Weeks   Status Achieved           PT Long Term Goals - 01/23/17 1643      PT LONG TERM GOAL #1   Title Pt to demo decreased pain in neck, to 0-1/10 with sitting work duties   Baseline 10/17: still with pain up to 5/10   Status On-going   Target Date 03/06/17     PT LONG TERM GOAL #2   Title Pt to demo improved cervical  ROM to be WNL and pain free, to improve ability for ADLS and work duties    Baseline 10/17: no pain but still with mild limitation   Status Partially Met     PT LONG TERM GOAL #3   Title Pt to demo ability for full L shoulder AROM without increased pain, to improve abiity for IADLs and reaching, lifting, carrying.    Status Achieved     PT LONG TERM GOAL #4   Title improve Lt cervical rotation and sidebending to WNL for improved function   Status New   Target Date 03/06/17               Plan - 01/23/17 1644    Clinical Impression Statement Pt has met LTG #3, partially met #2 and LTG #1 is still ongoing.  Pt reports overall improvement in function and symptoms but continues to have pain and tightness.  Recommend additional PT 1x/wk x 6 week to address tightness and help decrease pain and improve motion.   Rehab Potential Good   PT Frequency 1x / week   PT Duration 6 weeks   PT Treatment/Interventions ADLs/Self Care Home Management;Electrical Stimulation;Moist  Heat;Ultrasound;Neuromuscular re-education;Therapeutic activities;Therapeutic exercise;Patient/family education;Manual techniques;Passive range of motion;Taping;Dry needling   PT Next Visit Plan assess response to DN, manual, review HEP   PT Home Exercise Plan Upper trap stretch; scap squeezes, shoulder rolls;  Pec stretch at wall w nerve glide;    Consulted and Agree with Plan of Care Patient      Patient will benefit from skilled therapeutic intervention in order to improve the following deficits and impairments:  Hypomobility, Decreased strength, Impaired UE functional use, Pain, Increased muscle spasms, Decreased range of motion, Improper body mechanics  Visit Diagnosis: Cervicalgia - Plan: PT plan of care cert/re-cert     Problem List Patient Active Problem List   Diagnosis Date Noted  . Anterior tibialis tendinitis of left leg 01/16/2017  . Osteoarthritis of spine with radiculopathy, cervical region 01/16/2017  . 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 Osteoarthritis of spine 10/30/2016  . Vitamin D deficiency 10/02/2016  . Pain in both knees 09/12/2016      Laureen Abrahams, PT, DPT 01/23/17 4:47 PM    [facility] [place], [place], [address] Phone: [phone]   Fax:  [phone]  Name: DEJAE BERNET MRN: [id number] Date of Birth: [birth year]

## 2017-01-24 ENCOUNTER — Telehealth: Payer: Self-pay | Admitting: Sports Medicine

## 2017-01-24 NOTE — Telephone Encounter (Signed)
Would like a call back about Pennsaid script. Would like to know why it was prescribed and dosage.  Ty,  _LL

## 2017-01-24 NOTE — Telephone Encounter (Signed)
Per OV note: Patient has focal tenderness over the anterior portion of the left ankle directly over the anterior tibialis insertion.  We will have her begin with topical anti-inflammatories

## 2017-01-25 NOTE — Telephone Encounter (Signed)
Called [facility] and advised of ICD-10 code and that no documentation of other meds tried and/or failed.

## 2017-01-30 ENCOUNTER — Ambulatory Visit (INDEPENDENT_AMBULATORY_CARE_PROVIDER_SITE_OTHER): Payer: 59 | Admitting: Physical Therapy

## 2017-01-30 DIAGNOSIS — M542 Cervicalgia: Secondary | ICD-10-CM

## 2017-01-30 NOTE — Therapy (Addendum)
[facility] [address] [place], [place], [address] Phone: [phone]   Fax:  [phone]  Physical Therapy Treatment/Discharge  Patient Details  Name: Natasha Murphy MRN: [id number] Date of Birth: [birth year] Referring Provider: Teresa Coombs  Encounter Date: 01/30/2017      PT End of Session - 01/30/17 1006    Visit Number 7   Number of Visits 12   Date for PT Re-Evaluation 03/06/17   PT Start Time 0935   PT Stop Time 1018   PT Time Calculation (min) 43 min   Activity Tolerance Patient tolerated treatment well   Behavior During Therapy [facility] for tasks assessed/performed      Past Medical History:  Diagnosis Date  . HSV-2 seropositive   . Hx of chlamydia infection 1995, 01/2009  . Hx of gonorrhea 1995  . Lactose intolerance   . Varicose veins   . Vitamin D deficiency     Past Surgical History:  Procedure Laterality Date  . CESAREAN SECTION WITH BILATERAL TUBAL LIGATION    . UMBILICAL HERNIA REPAIR  age 19    There were no vitals filed for this visit.      Subjective Assessment - 01/30/17 0934    Subjective tension feels better.  had a headache after DN and reports she "felt like I was drugged."  neck and LUE seems to be better; but the thoracic spine is a little painful.     Patient Stated Goals Decreased pain, working/sitting without pain.    Currently in Pain? No/denies   Pain Score 0-No pain                         [facility] Adult PT Treatment/Exercise - 01/30/17 1005      Moist Heat Therapy   Number Minutes Moist Heat 15 Minutes   Moist Heat Location Cervical     Electrical Stimulation   Electrical Stimulation Location Lt upper trap   Electrical Stimulation Action premod x 15 min   Electrical Stimulation Parameters to tolerance   Electrical Stimulation Goals Pain;Tone     Manual Therapy   Manual Therapy Soft tissue mobilization;Myofascial release   Soft tissue mobilization Lt upper  trap, cervical paraspinals   Myofascial Release Lt upper trap, sub occipital release                PT Education - 01/30/17 1006    Education provided Yes   Education Details TENS unit   Person(s) Educated Patient   Methods Explanation;Handout   Comprehension Verbalized understanding          PT Short Term Goals - 01/02/17 1524      PT SHORT TERM GOAL #1   Title Pt to demo decreased pain in neck, to 2/10 with work activities    Time 2   Period Weeks   Status Achieved           PT Long Term Goals - 01/23/17 1643      PT LONG TERM GOAL #1   Title Pt to demo decreased pain in neck, to 0-1/10 with sitting work duties   Baseline 10/17: still with pain up to 5/10   Status On-going   Target Date 03/06/17     PT LONG TERM GOAL #2   Title Pt to demo improved cervical ROM to be WNL and pain free, to improve ability for ADLS and work duties    Baseline 10/17: no pain but still with mild  limitation   Status Partially Met     PT LONG TERM GOAL #3   Title Pt to demo ability for full L shoulder AROM without increased pain, to improve abiity for IADLs and reaching, lifting, carrying.    Status Achieved     PT LONG TERM GOAL #4   Title improve Lt cervical rotation and sidebending to WNL for improved function   Status New   Target Date 03/06/17               Plan - 01/30/17 1006    Clinical Impression Statement Pt requested to hold DN at this time due to increased fatigue following last session and pt has to go to work today.  Manual and modalities performed today as those seem to provide most relief to pt.  Will possibly plan to DN again next session.   PT Treatment/Interventions ADLs/Self Care Home Management;Electrical Stimulation;Moist Heat;Ultrasound;Neuromuscular re-education;Therapeutic activities;Therapeutic exercise;Patient/family education;Manual techniques;Passive range of motion;Taping;Dry needling   PT Next Visit Plan continue DN PRN, manual, review  HEP   Consulted and Agree with Plan of Care Patient      Patient will benefit from skilled therapeutic intervention in order to improve the following deficits and impairments:  Hypomobility, Decreased strength, Impaired UE functional use, Pain, Increased muscle spasms, Decreased range of motion, Improper body mechanics  Visit Diagnosis: Cervicalgia     Problem List Patient Active Problem List   Diagnosis Date Noted  . Anterior tibialis tendinitis of left leg 01/16/2017  . Osteoarthritis of spine with radiculopathy, cervical region 01/16/2017  . 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 Osteoarthritis of spine 10/30/2016  . Vitamin D deficiency 10/02/2016  . Pain in both knees 09/12/2016      Laureen Abrahams, PT, DPT 01/30/17 10:09 AM    [facility] [place], [place], [address] Phone: [phone]   Fax:  [phone]  Name: CHRISOULA ZEGARRA MRN: [id number] Date of Birth: [birth year]      PHYSICAL THERAPY DISCHARGE SUMMARY  Visits from Start of Care: 7  Current functional level related to goals / functional outcomes: See above   Remaining deficits: See above   Education / Equipment: HEP  Plan: Patient agrees to discharge.  Patient goals were partially met. Patient is being discharged due to not returning since the last visit.  ?????     Laureen Abrahams, PT, DPT 03/04/17 8:54 AM   [facility] [address] [place], [place], [address] Phone: [phone]  Fax: [phone]

## 2017-01-30 NOTE — Patient Instructions (Signed)

## 2017-02-24 ENCOUNTER — Other Ambulatory Visit: Payer: Self-pay | Admitting: Family Medicine

## 2017-02-25 NOTE — Telephone Encounter (Signed)
Recheck lab first.

## 2017-02-25 NOTE — Telephone Encounter (Signed)
Do you want the patient to stay at this dose.

## 2017-02-26 NOTE — Telephone Encounter (Signed)
LM for patient to return call.

## 2017-02-27 ENCOUNTER — Ambulatory Visit: Payer: 59 | Admitting: Sports Medicine

## 2017-03-28 ENCOUNTER — Other Ambulatory Visit: Payer: Self-pay | Admitting: Family Medicine

## 2017-03-28 DIAGNOSIS — E559 Vitamin D deficiency, unspecified: Secondary | ICD-10-CM

## 2017-03-29 NOTE — Telephone Encounter (Signed)
Left message for patient to call the office to schedule lab appointment to recheck Vitamin D. I have placed future order for lab.

## 2017-03-29 NOTE — Telephone Encounter (Signed)
Recheck and take daily 2000 IU.

## 2017-03-29 NOTE — Telephone Encounter (Signed)
Do you want the patient to continue on High dose or have labs rechecked.

## 2017-04-05 ENCOUNTER — Other Ambulatory Visit (INDEPENDENT_AMBULATORY_CARE_PROVIDER_SITE_OTHER): Payer: 59

## 2017-04-05 DIAGNOSIS — E559 Vitamin D deficiency, unspecified: Secondary | ICD-10-CM

## 2017-04-05 NOTE — Addendum Note (Signed)
Addended by: Felix AhmadiFRANSEN, Rigby Swamy A on: 04/05/2017 03:38 PM   Modules accepted: Orders

## 2017-04-06 LAB — VITAMIN D 25 HYDROXY (VIT D DEFICIENCY, FRACTURES): Vit D, 25-Hydroxy: 23 ng/mL — ABNORMAL LOW (ref 30–100)

## 2017-04-06 MED ORDER — CHOLECALCIFEROL 1.25 MG (50000 UT) PO TABS: ORAL_TABLET | ORAL | 0 refills | Status: DC

## 2017-04-06 NOTE — Addendum Note (Signed)
Addended by: Helane RimaWALLACE, Jlyn Cerros R on: 04/06/2017 02:17 PM   Modules accepted: Orders

## 2017-06-25 ENCOUNTER — Encounter: Payer: Self-pay | Admitting: Sports Medicine

## 2017-06-25 ENCOUNTER — Ambulatory Visit (INDEPENDENT_AMBULATORY_CARE_PROVIDER_SITE_OTHER): Payer: 59 | Admitting: Sports Medicine

## 2017-06-25 VITALS — BP 114/86 | HR 74 | Ht 67.0 in | Wt 209.2 lb

## 2017-06-25 DIAGNOSIS — M4722 Other spondylosis with radiculopathy, cervical region: Secondary | ICD-10-CM | POA: Diagnosis not present

## 2017-06-25 DIAGNOSIS — Q7649 Other congenital malformations of spine, not associated with scoliosis: Secondary | ICD-10-CM | POA: Diagnosis not present

## 2017-06-25 DIAGNOSIS — M542 Cervicalgia: Secondary | ICD-10-CM

## 2017-06-25 NOTE — Progress Notes (Addendum)
   Veverly FellsMichael D. Delorise Shinerigby, DO  [facility] [phone]  Natasha Murphy - 47 y.o. female MRN [id number]  Date of birth: 19-Feb-1971  Visit Date:   PCP: Helane RimaWallace, Erica, DO   Referred by: Helane RimaWallace, Erica, DO  Please see additional documentation for HPI, review of systems.  HISTORY & PERTINENT PRIOR DATA:  Prior History reviewed and updated per electronic medical record.  Significant history, findings, studies and interim changes include:  reports that  has never smoked. she has never used smokeless tobacco. No results for input(s): HGBA1C, LABURIC, CREATINE in the last 8760 hours. No specialty comments available. Problem  Congenital Anomaly of Cervical Spine   Consistent with a Klippel-Feil congenital anomaly.  No further indication or evaluation indicated at this time.  Will need to be cognizant of this at the time of intervention if it comes to that.     OBJECTIVE:  VS:  HT:5\' 7"  (170.2 cm)   WT:209 lb 3.2 oz (94.9 kg)  BMI:32.76    BP:114/86  HR:74bpm  TEMP: ( )  RESP:99 %   PHYSICAL EXAM: Constitutional: WDWN, Non-toxic appearing. Psychiatric: Alert & appropriately interactive.  Not depressed or anxious appearing. Respiratory: No increased work of breathing.  Trachea Midline Eyes: Pupils are equal.  EOM intact without nystagmus.  No scleral icterus  NEUROVASCULAR exam: No clubbing or cyanosis appreciated No significant venous stasis changes Capillary Refill: normal, less than 2 seconds  Extremities warm to touch, pink, with no edema.  Neck range of motion and.  Scapular/paracervical muscle spasms and tightness are improved.  Small amount of persistent pain with brachial plexus squeeze and arm squeeze test but upper extremity strength and sensation is otherwise intact.  ASSESSMENT & PLAN:   1. Cervicalgia   2. Osteoarthritis of spine with radiculopathy, cervical region   3. Congenital anomaly of cervical spine     Orders & Meds:  Orders Placed This Encounter  Procedures  . Ambulatory referral to Physical Therapy   No orders of the defined types were placed in this encounter.   PLAN:   Discussed the foundation of treatment for this condition is physical therapy and/or daily (5-6 days/week) therapeutic exercises, focusing on core strengthening, coordination, neuromuscular control/reeducation.  Therapeutic exercises prescribed per procedure note.  Dry needling would likely be beneficial as well given the persistent muscle spasms and I would like for her to be evaluated for this.  We will plan to have her also begin on different foundations training exercises likes to these were provided today.  If any lack of improvement always happy to see her back sooner  Congenital anomaly of cervical spine Overall her symptoms have improved she does have intermittent flareups.  This time no red flag symptoms based on exam or per history.  I would like for her to continue with home therapeutic exercises and consider dry needling. She will follow-up as needed if any acute flareups but otherwise will reevaluate in 6 months to ensure continued improvement and to discuss further FMLA accommodations if needed.  >50% of this 25 minute visit spent in direct patient counseling and/or coordination of care.  Discussion was focused on education regarding the in discussing the pathoetiology and anticipated clinical course of the above condition.  Follow-up: Return in about 6 months (around 12/26/2017).

## 2017-06-25 NOTE — Patient Instructions (Signed)
https://youtu.be/[email or web address]   Check out the above link.    Also check out [employer]"Foundation Training" which is a program developed by Dr. Myles LippsEric Goodman.   There are links to a couple of his YouTube Videos below and I would like to see performing one of his videos 5-6 days per week.    A good intro video is: "Independence from Pain 7-minute Video" - https://riley.[email or web address]://www.[email or web address]/watch?[email or web address]   His more advanced video is: "Powerful Posture and Pain Relief: 12 minutes of Foundation Training" - https://youtu.be/[email or web address]  Do not try to attempt this entire video when first beginning.    Try breaking of each exercise that he goes into shorter segments.  Otherwise if they perform an exercise for 45 seconds, start with 15 seconds and rest and then resume when they begin the new activity.    If you work your way up to doing this 12 minute video, I expect you will see significant improvements in your pain.  If you enjoy his videos and would like to find out more you can look on his website: [email or web address].  He has a workout streaming option as well as a DVD set available for purchase.  Amazon has the best price for his DVDs.

## 2017-06-25 NOTE — Progress Notes (Signed)
  Natasha Murphy - 47 y.o. female MRN [id number]  Date of birth: [birth year]  Scribe for today's visit: Christoper FabianMolly Weber, LAT, ATC     SUBJECTIVE:  Natasha Murphy is here for Follow-up (Neck and back pain) .   Notes from OV on 01/16/17: Ms. Natasha Murphy is an established pt presenting today for f/u of cervical pain. She was last seen 12/05/16 and has been doing PT.   Pt reports improvement in pain since started PT. She feels that there isn't as much tightness and irritation in the neck. She still has tenderness to palpation. She has discussed dry needling with Natasha Murphy (PT) and feels this may be beneficial. She has been working on posture and stretches while sitting but she does still have some discomfort while working. She has no neck pain while sleeping.   Pt c/o pain and tingling on the medial aspect of her LT foot. She broke a foot a few years ago when it was caught in a car door.    Compared to the last office visit on 01/16/17, her previously described neck and back pain symptoms are improving w/ decreased spasms noted.  She started working again beginning on June 07, 2017 and feels like her symptoms are increasing since resuming work. Current symptoms are moderate w/ mainly neck discomfort and muscle spasms & are radiating to L UE intermittently w/ muscle spasms in the L UE. She completed PT in Oct and has been continuing her HEP.  She is no longer taking Gabapentin.   ROS Denies night time disturbances. Denies fevers, chills, or night sweats. Denies unexplained weight loss. Denies personal history of cancer. Denies changes in bowel or bladder habits. Denies recent unreported falls. Denies new or worsening dyspnea or wheezing. Denies headaches or dizziness.  Denies numbness, tingling or weakness  In the extremities.  Denies dizziness or presyncopal episodes Denies lower extremity edema      Please see additional documentation for Objective, Assessment and Plan sections. Pertinent  additional documentation may be included in corresponding procedure notes, imaging studies, problem based documentation and patient instructions. Please see these sections of the encounter for additional information regarding this visit.  CMA/ATC served as Neurosurgeonscribe during this visit. History, Physical, and Plan performed by medical provider. Documentation and orders reviewed and attested to.      Andrena MewsMichael D Rigby, DO    [facility]

## 2017-06-25 NOTE — Assessment & Plan Note (Signed)
Overall her symptoms have improved she does have intermittent flareups.  This time no red flag symptoms based on exam or per history.  I would like for her to continue with home therapeutic exercises and consider dry needling. She will follow-up as needed if any acute flareups but otherwise will reevaluate in 6 months to ensure continued improvement and to discuss further FMLA accommodations if needed.

## 2017-07-03 ENCOUNTER — Encounter: Payer: Self-pay | Admitting: Physical Therapy

## 2017-07-03 ENCOUNTER — Ambulatory Visit (INDEPENDENT_AMBULATORY_CARE_PROVIDER_SITE_OTHER): Payer: 59 | Admitting: Physical Therapy

## 2017-07-03 DIAGNOSIS — M542 Cervicalgia: Secondary | ICD-10-CM | POA: Diagnosis not present

## 2017-07-03 DIAGNOSIS — R293 Abnormal posture: Secondary | ICD-10-CM | POA: Diagnosis not present

## 2017-07-03 NOTE — Therapy (Signed)
[facility] [address] [place], [place], [address] Phone: [phone]   Fax:  [phone]  Physical Therapy Evaluation  Patient Details  Name: Natasha Murphy MRN: [id number] Date of Birth: 08-22-1970 Referring Provider: Gaspar Bidding   Encounter Date: 07/03/2017  PT End of Session - 07/03/17 1105    Visit Number  1    Number of Visits  12    Date for PT Re-Evaluation  08/14/17    PT Start Time  1045    PT Stop Time  1128    PT Time Calculation (min)  43 min    Activity Tolerance  Patient tolerated treatment well;Patient limited by pain    Behavior During Therapy  [facility] for tasks assessed/performed       Past Medical History:  Diagnosis Date  . HSV-2 seropositive   . Hx of chlamydia infection 1995, 01/2009  . Hx of gonorrhea 1995  . Lactose intolerance   . Varicose veins   . Vitamin D deficiency     Past Surgical History:  Procedure Laterality Date  . CESAREAN SECTION WITH BILATERAL TUBAL LIGATION    . UMBILICAL HERNIA REPAIR  age 3    There were no vitals filed for this visit.   Subjective Assessment - 07/03/17 1049    Subjective  Pt states increased pain in L side of neck and down into L thoracic region. She has stressful job, Clinical biochemist work. She has been able to stand up and move around a bit more than previously. She notices pain most in AM, and during work day. She has had same pain previously, and was seen in PT, with + results. She denies any headaches, or numbness/tingling.     Limitations  Lifting;House hold activities;Writing    Diagnostic tests  + MRI, x-ray     Patient Stated Goals  Decreased pain     Currently in Pain?  Yes    Pain Score  6     Pain Location  Neck    Pain Orientation  Left    Pain Descriptors / Indicators  Tightness;Aching    Pain Type  Chronic pain    Pain Onset  More than a month ago    Pain Frequency  Intermittent         [facility] PT Assessment - 07/03/17 0001      Assessment   Medical Diagnosis  Cervicalgia    Referring Provider  Gaspar Bidding    Hand Dominance  Right    Prior Therapy  Yes      Precautions   Precautions  None      Restrictions   Weight Bearing Restrictions  No      Balance Screen   Has the patient fallen in the past 6 months  No      Prior Function   Level of Independence  Independent      Cognition   Overall Cognitive Status  Within Functional Limits for tasks assessed      ROM / Strength   AROM / PROM / Strength  AROM;Strength      AROM   Overall AROM Comments  Cervical ROM: Rotation: 50L; 55R, Mild soreness with L rotation;  Flex/Ext: WNL:        Strength   Overall Strength Comments  Shoulder Strength: 4+/5; Scapular; 4/5       Palpation   Palpation comment  Increased tightness and soreness in L UT,  and into L rhomboid, mild swelling at L  distal clavicle region compared to R. Pain at 1st rib with palpation      Special Tests   Other special tests  Mild ULTT on L; Negative Spurlings;               No data recorded  Objective measurements completed on examination: See above findings.      [facility] Adult PT Treatment/Exercise - 07/03/17 0001      Exercises   Exercises  Neck      Neck Exercises: Seated   Neck Retraction  20 reps    Shoulder Rolls  20 reps      Neck Exercises: Supine   Other Supine Exercise  Nerve glides with pec stretch 2 positoins, x15 each;       Manual Therapy   Manual Therapy  Taping    Kinesiotex  Inhibit Muscle      Kinesiotix   Inhibit Muscle   2 I strips for posture/ shoulder retraction on L      Neck Exercises: Stretches   Upper Trapezius Stretch  3 reps;30 seconds    Corner Stretch  3 reps;30 seconds             PT Education - 07/03/17 1054    Education provided  Yes    Education Details  HEP , posutre at work     [employer]) Educated  Patient    Methods  Explanation;Demonstration;Verbal cues;Handout    Comprehension  Verbalized understanding;Need  further instruction       PT Short Term Goals - 07/03/17 1142      PT SHORT TERM GOAL #1   Title  Pt to demo decreased pain in neck, to 5/10 with work activities     Time  2    Period  Weeks    Status  New        PT Long Term Goals - 07/03/17 1142      PT LONG TERM GOAL #1   Title  Pt to demo decreased pain in neck, to 0-1/10 with work duties    Time  6    Period  Weeks    Status  New    Target Date  08/14/17      PT LONG TERM GOAL #2   Title  Pt to demo improved cervical ROM to be WNL and pain free, to improve ability for ADLS and work duties     Time  6    Period  Weeks    Status  New    Target Date  08/14/17      PT LONG TERM GOAL #3   Title  Pt to demo independence with final HEP for posture and strengthening     Period  Weeks    Status  New    Target Date  08/14/17      PT LONG TERM GOAL #4   Title  Pt to demo soft tissue restrictions in L UT to be [facility] , to improve pain     Time  6    Period  Weeks    Status  New    Target Date  08/14/17             Plan - 07/03/17 1136    Clinical Impression Statement  Pt presents with primary complaint of increased pain in L UT region. She has increased tightness and sorness with palpation. She has mild decrease in cervical ROM,and increased pain with L rotation. She has mild strength deficits for scapular  muscles. She has decreased posture with sitting and work duties, and will benefit from continued education on this. She has  increased ULTT on L, but no numbness/tingling with compression. Pt with decreased ablity for full functional activities,and work duties, due to pain. Pts symptoms likely due to cervical degeneration, posture, work duties, and muscle tightness. Pt to benefit from skilled PT to improve pain and deficits. Pt with positive response previously in PT.     Clinical Presentation  Stable    Clinical Decision Making  Low    Rehab Potential  Good    PT Frequency  2x / week    PT Duration  6 weeks    PT  Treatment/Interventions  ADLs/Self Care Home Management;Cryotherapy;Electrical Stimulation;Iontophoresis 4mg /ml Dexamethasone;Moist Heat;Therapeutic exercise;Therapeutic activities;Functional mobility training;Ultrasound;Neuromuscular re-education;Patient/family education;Manual techniques;Taping;Dry needling;Passive range of motion    Consulted and Agree with Plan of Care  Patient       Patient will benefit from skilled therapeutic intervention in order to improve the following deficits and impairments:  Decreased strength, Pain, Increased muscle spasms, Decreased range of motion, Improper body mechanics, Impaired flexibility, Hypermobility  Visit Diagnosis: Cervicalgia  Abnormal posture     Problem List Patient Active Problem List   Diagnosis Date Noted  . Anterior tibialis tendinitis of left leg 01/16/2017  . Osteoarthritis of spine with radiculopathy, cervical region 01/16/2017  . 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 Osteoarthritis of spine 10/30/2016  . Vitamin D deficiency 10/02/2016  . Pain in both knees 09/12/2016  Sedalia MutaLauren Brinda Focht, PT, DPT 11:49 AM  07/03/17    [facility] [address] [place], [place], [address] Phone: [phone]   Fax:  [phone]  Name: Natasha Murphy MRN: [id number] Date of Birth: 15-May-1970

## 2017-07-03 NOTE — Patient Instructions (Signed)
Pt still has handout from previous PT:  Given today:  Doorway stretch UT stretch    Both 30 sec x3 , 3x/day  Chin Tucks Scap Squeezes UE nerve glides, supine with pec stretch All 2x10, 2x/day

## 2017-07-04 ENCOUNTER — Other Ambulatory Visit: Payer: Self-pay | Admitting: Family Medicine

## 2017-07-10 ENCOUNTER — Encounter: Payer: Self-pay | Admitting: Physical Therapy

## 2017-07-10 ENCOUNTER — Ambulatory Visit: Payer: 59 | Attending: Family Medicine | Admitting: Physical Therapy

## 2017-07-10 DIAGNOSIS — M542 Cervicalgia: Secondary | ICD-10-CM | POA: Diagnosis not present

## 2017-07-10 NOTE — Therapy (Signed)
[facility] [address] [place], [place], [address] Phone: [phone]   Fax:  [phone]  Physical Therapy Treatment  Patient Details  Name: Natasha Murphy MRN: [id number] Date of Birth: [birth year] Referring Provider: Gaspar BiddingMichael Rigby   Encounter Date: 07/10/2017  PT End of Session - 07/10/17 1101    Visit Number  2    Number of Visits  12    Date for PT Re-Evaluation  08/14/17    PT Start Time  1015    PT Stop Time  1100    PT Time Calculation (min)  45 min    Activity Tolerance  Patient tolerated treatment well;Patient limited by pain    Behavior During Therapy  [facility] for tasks assessed/performed       Past Medical History:  Diagnosis Date  . HSV-2 seropositive   . Hx of chlamydia infection 1995, 01/2009  . Hx of gonorrhea 1995  . Lactose intolerance   . Varicose veins   . Vitamin D deficiency     Past Surgical History:  Procedure Laterality Date  . CESAREAN SECTION WITH BILATERAL TUBAL LIGATION    . UMBILICAL HERNIA REPAIR  age 89    There were no vitals filed for this visit.  Subjective Assessment - 07/10/17 1016    Subjective  I haven't had much relief from the exercises yet.    Currently in Pain?  Yes    Pain Score  6     Pain Location  Neck    Pain Orientation  Left    Pain Descriptors / Indicators  Tightness    Pain Type  Chronic pain    Pain Onset  More than a month ago    Pain Frequency  Intermittent    Aggravating Factors   working on computer at job, sitting    Pain Relieving Factors  massage pillow helps    Multiple Pain Sites  No                       [facility] Adult PT Treatment/Exercise - 07/10/17 0001      Lumbar Exercises: Supine   Ab Set  10 reps    Large Ball Abdominal Isometric  10 reps press red ball    Other Supine Lumbar Exercises  bent knee drop out      Manual Therapy   Manual Therapy  Soft tissue mobilization;Myofascial release    Soft tissue  mobilization  Left upper trap, thoracic paraspinals, suboccipitals and cervical paraspinals    Myofascial Release  thoracolumbar fascia      Neck Exercises: Stretches   Upper Trapezius Stretch  3 reps;30 seconds             PT Education - 07/10/17 1101    Education provided  Yes    Education Details  Access Code: [email or web address]    Person(s) Educated  Patient    Methods  Explanation;Demonstration;Handout    Comprehension  Verbalized understanding;Returned demonstration       PT Short Term Goals - 07/03/17 1142      PT SHORT TERM GOAL #1   Title  Pt to demo decreased pain in neck, to 5/10 with work activities     Time  2    Period  Weeks    Status  New        PT Long Term Goals - 07/03/17 1142      PT LONG TERM GOAL #1   Title  Pt to demo decreased pain in neck, to 0-1/10 with work duties    Time  6    Period  Weeks    Status  New    Target Date  08/14/17      PT LONG TERM GOAL #2   Title  Pt to demo improved cervical ROM to be WNL and pain free, to improve ability for ADLS and work duties     Time  6    Period  Weeks    Status  New    Target Date  08/14/17      PT LONG TERM GOAL #3   Title  Pt to demo independence with final HEP for posture and strengthening     Period  Weeks    Status  New    Target Date  08/14/17      PT LONG TERM GOAL #4   Title  Pt to demo soft tissue restrictions in L UT to be [facility] , to improve pain     Time  6    Period  Weeks    Status  New    Target Date  08/14/17            Plan - 07/10/17 1144    Clinical Impression Statement  Patient has muscle spasms throughout left side in UT, thoracic and lumbar paraspinals.  She has hypomobility from T10-12 and restriction in thoracolumbar fascia. Pt responded well to STM and MFR with greater mobility and decreased pain after treatment.  She was able to activate her transverse abdominus for improved posture control and educated on how to perform in sitting and supine. Pt will benefit from  sikilled PT to address postural strength and improved soft tissue length.    PT Treatment/Interventions  ADLs/Self Care Home Management;Cryotherapy;Electrical Stimulation;Iontophoresis 4mg /ml Dexamethasone;Moist Heat;Therapeutic exercise;Therapeutic activities;Functional mobility training;Ultrasound;Neuromuscular re-education;Patient/family education;Manual techniques;Taping;Dry needling;Passive range of motion    PT Next Visit Plan  STM as needed, core and posture strength, cervical exercises, scap stability    Consulted and Agree with Plan of Care  Patient       Patient will benefit from skilled therapeutic intervention in order to improve the following deficits and impairments:  Decreased strength, Pain, Increased muscle spasms, Decreased range of motion, Improper body mechanics, Impaired flexibility, Hypermobility  Visit Diagnosis: Cervicalgia     Problem List Patient Active Problem List   Diagnosis Date Noted  . Anterior tibialis tendinitis of left leg 01/16/2017  . Osteoarthritis of spine with radiculopathy, cervical region 01/16/2017  . 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 Osteoarthritis of spine 10/30/2016  . Vitamin D deficiency 10/02/2016  . Pain in both knees 09/12/2016    Vincente Poli, PT 07/10/2017, 12:39 PM  [facility] [address], [address] [place], [place], [address] Phone: [phone]   Fax:  [phone]  Name: Natasha Murphy MRN: [id number] Date of Birth: 1970/05/14

## 2017-07-10 NOTE — Patient Instructions (Signed)
Access Code: [email or web address]  URL: [email or web address]  Date: 07/10/2017  Prepared by: Dorie RankJacqueline Crosser   Exercises  Supine Transversus Abdominis Bracing - Hands on Thighs - 10 reps - 3 sets - 5 sec hold - 3x daily - 7x weekly  Supine Transversus Abdominis Bracing with Heel Slide - 10 reps - 2 sets - 1x daily - 7x weekly

## 2017-07-17 ENCOUNTER — Telehealth: Payer: Self-pay

## 2017-07-17 ENCOUNTER — Ambulatory Visit: Payer: 59 | Admitting: Physical Therapy

## 2017-07-17 DIAGNOSIS — M542 Cervicalgia: Secondary | ICD-10-CM

## 2017-07-17 NOTE — Telephone Encounter (Signed)
Patient called back and said that her paperwork is due before 07/28/17, if Dr. Berline Choughigby can please submitted it before than, thanks.

## 2017-07-17 NOTE — Patient Instructions (Signed)
Access Code: [email or web address]  URL: [email or web address]  Date: 07/17/2017  Prepared by: Dorie RankJacqueline Crosser   Exercises  Supine Transversus Abdominis Bracing - Hands on Thighs - 10 reps - 3 sets - 5 sec hold - 3x daily - 7x weekly  Supine Transversus Abdominis Bracing with Heel Slide - 10 reps - 2 sets - 1x daily - 7x weekly  Standing Shoulder Row with Anchored Resistance - 10 reps - 2 sets - 1x daily - 7x weekly  Shoulder Extension with Resistance - Neutral - 10 reps - 2 sets - 1x daily - 7x weekly  Beginner Bridge - 10 reps - 2 sets - 3 sec hold - 1x daily - 7x weekly  Hooklying Single Knee to Chest Stretch - 3 reps - 1 sets - 10 sec hold hold - 1x daily - 7x weekly

## 2017-07-17 NOTE — Therapy (Signed)
[facility] [address] [place], [place], [address] Phone: [phone]   Fax:  [phone]  Physical Therapy Treatment  Patient Details  Name: Natasha Murphy MRN: [id number] Date of Birth: 03/22/1971 Referring Provider: Gaspar BiddingMichael Rigby   Encounter Date: 07/17/2017  PT End of Session - 07/17/17 1021    Visit Number  3    Number of Visits  12    Date for PT Re-Evaluation  08/14/17    PT Start Time  1020    PT Stop Time  1100    PT Time Calculation (min)  40 min       Past Medical History:  Diagnosis Date  . HSV-2 seropositive   . Hx of chlamydia infection 1995, 01/2009  . Hx of gonorrhea 1995  . Lactose intolerance   . Varicose veins   . Vitamin D deficiency     Past Surgical History:  Procedure Laterality Date  . CESAREAN SECTION WITH BILATERAL TUBAL LIGATION    . UMBILICAL HERNIA REPAIR  age 21    There were no vitals filed for this visit.  Subjective Assessment - 07/17/17 1024    Subjective  I am standing up at work more, but still noticing knot in the left side of mid-back at the end of the day.  Denies pain currently but has a feeling of not quite right in left shoulder    Limitations  Lifting;House hold activities;Writing    Patient Stated Goals  Decreased pain     Currently in Pain?  No/denies                       [facility] - 07/17/17 0001      Neuro Re-ed    Neuro Re-ed Details   cervical and lumbar alignemnt; TrA engaged throughout exercises      Neck Exercises: Standing   Other Standing Exercises  row and extension red band - 20x with cervical and lumbar aloignment - needs verbal and tactile cues      Neck Exercises: Supine   Neck Retraction  10 reps;5 secs    Shoulder ABduction  Both;15 reps red band - horizontal abduction    Upper Extremity D2  Extension;10 reps;Theraband    Theraband Level (UE D2)  Level 2 (Red)      Lumbar Exercises: Supine    Bridge  10 reps    Other Supine Lumbar Exercises  single knee to chest - 3 x 20 sec hold      Manual Therapy   Soft tissue mobilization  thoracic paraspinals left T10-12, left pec major/minor               PT Short Term Goals - 07/03/17 1142      PT SHORT TERM GOAL #1   Title  Pt to demo decreased pain in neck, to 5/10 with work activities     Time  2    Period  Weeks    Status  New        PT Long Term Goals - 07/17/17 1053      PT LONG TERM GOAL #1   Title  Pt to demo decreased pain in neck, to 0-1/10 with work duties    Baseline  6/10 after work    Time  6    Period  Weeks    Status  On-going      PT LONG TERM GOAL #2   Title  Pt to demo improved cervical ROM to be WNL and pain free, to improve ability for ADLS and work duties     Time  6    Period  Weeks    Status  On-going      PT LONG TERM GOAL #3   Title  Pt to demo independence with final HEP for posture and strengthening     Time  6    Period  Weeks    Status  On-going      PT LONG TERM GOAL #4   Title  Pt to demo soft tissue restrictions in L UT to be [facility] , to improve pain     Time  6    Period  Weeks            Plan - 07/17/17 1102    Clinical Impression Statement  Patient continues to have a large trigger point left side at lower thoracic spine.  She was able to do new exercises with verbal and tactile cues.  She was monitored for pain during treatment and did not have increased pain.  Pt needs cues throughout with cues for pelvic alignment.  Pt will benefit from skilled PT to continue working on posture and strength.    Rehab Potential  Good    PT Treatment/Interventions  ADLs/Self Care Home Management;Cryotherapy;Electrical Stimulation;Iontophoresis 4mg /ml Dexamethasone;Moist Heat;Therapeutic exercise;Therapeutic activities;Functional mobility training;Ultrasound;Neuromuscular re-education;Patient/family education;Manual techniques;Taping;Dry needling;Passive range of motion    PT Next  Visit Plan  dry needle thoracic paraspinals and UT, STM as needed, core and posture strength, cervical exercises, scap stability, thoracic extension, lumbar flexion, pec stretch    Consulted and Agree with Plan of Care  Patient       Patient will benefit from skilled therapeutic intervention in order to improve the following deficits and impairments:  Decreased strength, Pain, Increased muscle spasms, Decreased range of motion, Improper body mechanics, Impaired flexibility, Hypermobility  Visit Diagnosis: Cervicalgia     Problem List Patient Active Problem List   Diagnosis Date Noted  . Anterior tibialis tendinitis of left leg 01/16/2017  . Osteoarthritis of spine with radiculopathy, cervical region 01/16/2017  . 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 Osteoarthritis of spine 10/30/2016  . Vitamin D deficiency 10/02/2016  . Pain in both knees 09/12/2016    Vincente Poli, PT 07/17/2017, 1:46 PM  [facility] [address], [address] [place], [place], [address] Phone: [phone]   Fax:  [phone]  Name: Natasha Murphy MRN: [id number] Date of Birth: 1970-10-30

## 2017-07-17 NOTE — Telephone Encounter (Signed)
Called pt and left VM to call the office.  

## 2017-07-17 NOTE — Telephone Encounter (Signed)
Received and to Dr. Berline Choughigby to review and sign.

## 2017-07-17 NOTE — Telephone Encounter (Signed)
Copied from CRM [id number]#[id number]. Topic: General - Other >> Jul 17, 2017  7:27 AM Gerrianne ScalePayne, Angela L wrote: Reason for CRM: patient calling to see if her FMLA paperwork is ready she brought them in to Dr Berline Choughigby a week in a half ago please call patient when ready at [phone]

## 2017-07-18 NOTE — Telephone Encounter (Signed)
Spoke with pt and advised that I got her message and I will give her a call when forms are ready for pick up.

## 2017-07-19 ENCOUNTER — Telehealth: Payer: Self-pay | Admitting: Sports Medicine

## 2017-07-19 NOTE — Telephone Encounter (Signed)
Called pt and advised forms ready for pick up. A copy will be scanned to pt chart.

## 2017-07-22 ENCOUNTER — Ambulatory Visit: Payer: 59 | Admitting: Family Medicine

## 2017-07-22 DIAGNOSIS — Z2089 Contact with and (suspected) exposure to other communicable diseases: Secondary | ICD-10-CM

## 2017-07-23 NOTE — Telephone Encounter (Signed)
Spoke with pt, she is missing the first page of FMLA form. Additional copy of form was made prior to returning form to patient. Form required pt signature, date, and asked if she had a serious health condition. Answer should be YES. On page 6, box should be checked for "chronic conditions requiring treatment". Pt will submit forms to employer. A copy will be scanned to chart.

## 2017-07-24 ENCOUNTER — Ambulatory Visit: Payer: 59 | Admitting: Family Medicine

## 2017-07-24 ENCOUNTER — Ambulatory Visit: Payer: 59

## 2017-07-24 ENCOUNTER — Encounter: Payer: Self-pay | Admitting: Family Medicine

## 2017-07-24 DIAGNOSIS — M542 Cervicalgia: Secondary | ICD-10-CM | POA: Diagnosis not present

## 2017-07-24 NOTE — Therapy (Signed)
[facility] [address], [address] [place], [place], [address] Phone: [phone]   Fax:  [phone]  Physical Therapy Treatment  Patient Details  Name: Natasha Murphy MRN: [id number] Date of Birth: 1970/10/03 Referring Provider: Gaspar Bidding   Encounter Date: 07/24/2017  PT End of Session - 07/24/17 1012    Visit Number  4    Date for PT Re-Evaluation  08/14/17    Authorization Type  UHC- 30 visit limit    Authorization - Visit Number  4    Authorization - Number of Visits  30    PT Start Time  0933    PT Stop Time  1012    PT Time Calculation (min)  39 min    Activity Tolerance  Patient tolerated treatment well    Behavior During Therapy  [facility] for tasks assessed/performed       Past Medical History:  Diagnosis Date  . HSV-2 seropositive   . Hx of chlamydia infection 1995, 01/2009  . Hx of gonorrhea 1995  . Lactose intolerance   . Varicose veins   . Vitamin D deficiency     Past Surgical History:  Procedure Laterality Date  . CESAREAN SECTION WITH BILATERAL TUBAL LIGATION    . UMBILICAL HERNIA REPAIR  age 52    There were no vitals filed for this visit.  Subjective Assessment - 07/24/17 0938    Subjective  I didn't work yesterday so I feel OK today.      Currently in Pain?  Yes    Pain Score  2     Pain Location  Neck    Pain Orientation  Left    Pain Descriptors / Indicators  Tightness    Pain Type  Chronic pain    Pain Onset  More than a month ago    Pain Frequency  Intermittent    Aggravating Factors   working at computer, sitting    Pain Relieving Factors  change of position                       [facility] Adult PT Treatment/Exercise - 07/24/17 0001      Neck Exercises: Machines for Strengthening   UBE (Upper Arm Bike)  Level 1x 6 minutes (3/3)      Neck Exercises: Standing   Other Standing Exercises  row and extension red band - 20x with cervical and lumbar aloignment - needs  verbal and tactile cues      Neck Exercises: Seated   Other Seated Exercise  cervicothoracic rotation 3x20 seconds      Neck Exercises: Supine   Shoulder ABduction  Both;15 reps red band - horizontal abduction.  On foam roll    Upper Extremity D2  Extension;10 reps;Theraband supine on foam roll    Theraband Level (UE D2)  Level 2 (Red)      Manual Therapy   Manual Therapy  Soft tissue mobilization;Myofascial release;Joint mobilization    Manual therapy comments  PA mobs T2-7 and trigger point release to bil thoracic paraspinals and rhomboids               PT Short Term Goals - 07/24/17 0940      PT SHORT TERM GOAL #1   Title  Pt to demo decreased pain in neck, to 5/10 with work activities     Baseline  knot in the middle of Lt mid back- up to 8/10    Time  2  Period  Weeks    Status  On-going        PT Long Term Goals - 07/17/17 1053      PT LONG TERM GOAL #1   Title  Pt to demo decreased pain in neck, to 0-1/10 with work duties    Baseline  6/10 after work    Time  6    Period  Weeks    Status  On-going      PT LONG TERM GOAL #2   Title  Pt to demo improved cervical ROM to be WNL and pain free, to improve ability for [employer] and work duties     Time  6    Period  Weeks    Status  On-going      PT LONG TERM GOAL #3   Title  Pt to demo independence with final HEP for posture and strengthening     Time  6    Period  Weeks    Status  On-going      PT LONG TERM GOAL #4   Title  Pt to demo soft tissue restrictions in L UT to be [facility] , to improve pain     Time  6    Period  Weeks            Plan - 07/24/17 0941    Clinical Impression Statement  Pt with conitnued trigger point in Lt thoracic spine.  Pt had dry needling in the past and had some lightheadedness so doesn't want to try today.  Pt is independent and compliant with HEP.  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  Pt reports up to 8/10 spasm in Lt thoracic spine  with work tasks at the end of the day.  Pt requires minor verbal cues for technique with exercises today to reduce substitution.  Pt will continue to benefit from skilled PT for posutral strength, cervical and thoracic mobility and pain management as needed.      Rehab Potential  Good    PT Frequency  2x / week    PT Duration  6 weeks    PT Treatment/Interventions  ADLs/Self Care Home Management;Cryotherapy;Electrical Stimulation;Iontophoresis 4mg /ml Dexamethasone;Moist Heat;Therapeutic exercise;Therapeutic activities;Functional mobility training;Ultrasound;Neuromuscular re-education;Patient/family education;Manual techniques;Taping;Dry needling;Passive range of motion    PT Next Visit Plan  dry needle thoracic paraspinals and UT (when pt is needed), STM as needed, core and posture strength, cervical exercises, scap stability, thoracic extension, lumbar flexion, pec stretch    Recommended Other Services  initial certification is signed    Consulted and Agree with Plan of Care  Patient       Patient will benefit from skilled therapeutic intervention in order to improve the following deficits and impairments:  Decreased strength, Pain, Increased muscle spasms, Decreased range of motion, Improper body mechanics, Impaired flexibility, Hypermobility  Visit Diagnosis: Cervicalgia     Problem List Patient Active Problem List   Diagnosis Date Noted  . Anterior tibialis tendinitis of left leg 01/16/2017  . Osteoarthritis of spine with radiculopathy, cervical region 01/16/2017  . 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 Osteoarthritis of spine 10/30/2016  . Vitamin D deficiency 10/02/2016  . Pain in both knees 09/12/2016     Lorrene ReidKelly Laressa Bolinger, PT 07/24/17 10:14 AM  [facility] [address], [address] [place], [place], [address] Phone: [phone]   Fax:  [phone]  Name: Natasha Murphy MRN: [id number] Date of Birth:  09/02/1970   

## 2017-07-31 ENCOUNTER — Ambulatory Visit: Payer: 59

## 2017-07-31 DIAGNOSIS — M542 Cervicalgia: Secondary | ICD-10-CM

## 2017-07-31 NOTE — Patient Instructions (Signed)
Access Code: [email or web address]  URL: [email or web address]  Date: 07/31/2017  Prepared by: Lorrene ReidKelly Ronell Boldin   Exercises  Sidelying Open Book Thoracic Rotation with Knee on Foam Roll - 10 reps - 1 sets - 2x daily - 7x weekly  Cat-Camel - 10 reps - 1 sets - 5 hold - 2x daily - 7x weekly  Child's Pose Stretch - 10 reps - 3 sets - 1x daily - 7x weekly  Child's Pose with Sidebending - 10 reps - 3 sets - 1x daily - 7x weekly    [facility] [address], [address] [place], [place] [address] Phone # [phone] Fax [phone]

## 2017-07-31 NOTE — Therapy (Signed)
[facility] [address], [address] [place], [place], [address] Phone: [phone]   Fax:  [phone]  Physical Therapy Treatment  Patient Details  Name: Natasha Murphy M Roop MRN: [id number] Date of Birth: 11/15/1970 Referring Provider: Gaspar BiddingMichael Rigby   Encounter Date: 07/31/2017  PT End of Session - 07/31/17 1652    Visit Number  5    Number of Visits  12    Date for PT Re-Evaluation  08/14/17    Authorization Type  UHC- 30 visit limit    Authorization - Visit Number  5    Authorization - Number of Visits  30    PT Start Time  1619    PT Stop Time  1651    PT Time Calculation (min)  32 min    Activity Tolerance  Patient tolerated treatment well    Behavior During Therapy  [facility] for tasks assessed/performed       Past Medical History:  Diagnosis Date  . HSV-2 seropositive   . Hx of chlamydia infection 1995, 01/2009  . Hx of gonorrhea 1995  . Lactose intolerance   . Varicose veins   . Vitamin D deficiency     Past Surgical History:  Procedure Laterality Date  . CESAREAN SECTION WITH BILATERAL TUBAL LIGATION    . UMBILICAL HERNIA REPAIR  age 47    There were no vitals filed for this visit.  Subjective Assessment - 07/31/17 1625    Subjective  I am feeling better with therapy.  The foam roll helped me last session.  I feel 10% better and less stiff.      Currently in Pain?  Yes    Pain Score  5     Pain Location  Neck    Pain Orientation  Left    Pain Descriptors / Indicators  Tightness    Pain Type  Chronic pain    Pain Onset  More than a month ago    Pain Frequency  Intermittent    Aggravating Factors   working at computer, sitting, stress    Pain Relieving Factors  change of position                       [facility] Adult PT Treatment/Exercise - 07/31/17 0001      Neck Exercises: Machines for Strengthening   UBE (Upper Arm Bike)  Level 1x 6 minutes (3/3)      Neck Exercises: Standing   Other  Standing Exercises  row and extension red band - 20x with cervical and lumbar alignment - needs verbal and tactile cues      Neck Exercises: Seated   Other Seated Exercise  --      Neck Exercises: Supine   Shoulder ABduction  Both;20 reps red band - horizontal abduction.  On foam roll    Upper Extremity D2  Extension;10 reps;Theraband supine on foam roll    Theraband Level (UE D2)  Level 2 (Red)      Neck Exercises: Sidelying   Other Sidelying Exercise  open book stretch x10 each      Neck Exercises: Prone   Other Prone Exercise  quadruped: cat/camel  and prayer stretch bil and forward 3x20 seconds             PT Education - 07/31/17 1645    Education provided  Yes    Education Details   Access Code: [email or web address]    Person(s) Educated  Patient  Methods  Explanation;Demonstration;Handout    Comprehension  Verbalized understanding;Returned demonstration       PT Short Term Goals - 07/24/17 0940      PT SHORT TERM GOAL #1   Title  Pt to demo decreased pain in neck, to 5/10 with work activities     Baseline  knot in the middle of Lt mid back- up to 8/10    Time  2    Period  Weeks    Status  On-going        PT Long Term Goals - 07/17/17 1053      PT LONG TERM GOAL #1   Title  Pt to demo decreased pain in neck, to 0-1/10 with work duties    Baseline  6/10 after work    Time  6    Period  Weeks    Status  On-going      PT LONG TERM GOAL #2   Title  Pt to demo improved cervical ROM to be WNL and pain free, to improve ability for [employer] and work duties     Time  6    Period  Weeks    Status  On-going      PT LONG TERM GOAL #3   Title  Pt to demo independence with final HEP for posture and strengthening     Time  6    Period  Weeks    Status  On-going      PT LONG TERM GOAL #4   Title  Pt to demo soft tissue restrictions in L UT to be [facility] , to improve pain     Time  6    Period  Weeks            Plan - 07/31/17 1653    Clinical Impression Statement   Pt reports 10% overall improvement in symptoms since the start of care.  Pt main complaint is thoracic stiffness lt>Rt and increased pain with sitting at work.  PT issued thoracic flexibility exercises to address stiffness.  Pt is not ready to try dry needling yet.  Pt requires tactile cues for technique with exercise today.  Pt will continue to benefit from skilled PT for postural strength, cervical and thoracic mobility and pain management as needed.      Rehab Potential  Good    PT Frequency  2x / week    PT Duration  6 weeks    PT Treatment/Interventions  ADLs/Self Care Home Management;Cryotherapy;Electrical Stimulation;Iontophoresis 4mg /ml Dexamethasone;Moist Heat;Therapeutic exercise;Therapeutic activities;Functional mobility training;Ultrasound;Neuromuscular re-education;Patient/family education;Manual techniques;Taping;Dry needling;Passive range of motion    PT Next Visit Plan  thoracic mobility, postural strength, pec stretch    Consulted and Agree with Plan of Care  Patient       Patient will benefit from skilled therapeutic intervention in order to improve the following deficits and impairments:  Decreased strength, Pain, Increased muscle spasms, Decreased range of motion, Improper body mechanics, Impaired flexibility, Hypermobility  Visit Diagnosis: Cervicalgia     Problem List Patient Active Problem List   Diagnosis Date Noted  . Anterior tibialis tendinitis of left leg 01/16/2017  . Osteoarthritis of spine with radiculopathy, cervical region 01/16/2017  . 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 Osteoarthritis of spine 10/30/2016  . Vitamin D deficiency 10/02/2016  . Pain in both knees 09/12/2016    Natasha Murphy, PT 07/31/17 4:56 PM  [facility] [address] [place], [place], [address] Phone: [phone]  Fax:  [phone]  Name: Natasha Murphy MRN: [id number] Date of Birth:  Jul 31, 1970

## 2017-08-02 NOTE — Telephone Encounter (Signed)
erro  neous encounter

## 2017-08-05 ENCOUNTER — Telehealth: Payer: Self-pay

## 2017-08-05 NOTE — Telephone Encounter (Signed)
Spoke with patient, she needs correction to [facility] paperwork. She needs question 7 updated to say 2 flar ups per week, 8 hrs per flare up. She will bring forms by the office.

## 2017-08-05 NOTE — Telephone Encounter (Signed)
Copied from CRM [id number]. Topic: General - Other >> Aug 05, 2017  6:53 AM Leafy Ro wrote: Reason for CRM: pt is requesting brandi to return her call concerning FMLA

## 2017-08-14 ENCOUNTER — Ambulatory Visit: Payer: 59 | Attending: Family Medicine | Admitting: Physical Therapy

## 2017-08-14 ENCOUNTER — Encounter: Payer: Self-pay | Admitting: Physical Therapy

## 2017-08-14 DIAGNOSIS — M542 Cervicalgia: Secondary | ICD-10-CM

## 2017-08-14 NOTE — Therapy (Signed)
[facility] [address], [place], [place], [address] Phone: [phone]   Fax:  [phone]  Physical Therapy Treatment  Patient Details  Name: Natasha Murphy MRN: [id number] Date of Birth: 07-Jun-1970 Referring Provider: Teresa Coombs   Encounter Date: 08/14/2017  PT End of Session - 08/14/17 1618    Visit Number  6    Number of Visits  12    Date for PT Re-Evaluation  08/14/17    Authorization Type  UHC- 30 visit limit    Authorization - Visit Number  6    Authorization - Number of Visits  30    PT Start Time  5916    PT Stop Time  3846    PT Time Calculation (min)  31 min    Activity Tolerance  Patient tolerated treatment well    Behavior During Therapy  [facility] for tasks assessed/performed       Past Medical History:  Diagnosis Date  . HSV-2 seropositive   . Hx of chlamydia infection 1995, 01/2009  . Hx of gonorrhea 1995  . Lactose intolerance   . Varicose veins   . Vitamin D deficiency     Past Surgical History:  Procedure Laterality Date  . CESAREAN SECTION WITH BILATERAL TUBAL LIGATION    . UMBILICAL HERNIA REPAIR  age 70    There were no vitals filed for this visit.  Subjective Assessment - 08/14/17 1617    Subjective  Overall I feel 40% improved.  I think that stress makes things tighten up, but the exercises and stretches are helping.    Patient Stated Goals  Decreased pain     Currently in Pain?  Yes    Pain Score  6     Pain Location  Neck    Pain Orientation  Left    Pain Descriptors / Indicators  Tightness    Pain Type  Chronic pain    Pain Onset  More than a month ago    Pain Frequency  Intermittent    Multiple Pain Sites  No         [facility] PT Assessment - 08/14/17 0001      AROM   Overall AROM Comments  flex 70; ext 30; Lt rotation 50; Rt rotation 48                   [facility] Adult PT Treatment/Exercise - 08/14/17 0001      Self-Care   Self-Care  Other Self-Care  Comments    Other Self-Care Comments   self massage with tennis ball agains the wall      Neck Exercises: Machines for Strengthening   UBE (Upper Arm Bike)  Level 1x 6 minutes (3/3)      Neck Exercises: Supine   Other Supine Exercise  foam roller for cervical soft tissue release, rotation and flex/ext, thoracic ext with foam roll horizontal      Lumbar Exercises: Supine   Other Supine Lumbar Exercises  on foam roll i cervical retractions with bilateral external rotation of shoulders - yellow band x 20             PT Education - 08/14/17 1659    Education provided  Yes    Education Details  added to HEP  Access Code: [email or web address] , edu on tennis ball massage, foam roller    Person(s) Educated  Patient    Methods  Explanation;Demonstration;Handout    Comprehension  Verbalized understanding;Returned demonstration  PT Short Term Goals - 08/14/17 1629      PT SHORT TERM GOAL #1   Title  Pt to demo decreased pain in neck, to 5/10 with work activities     Baseline  gets up to 8/10    Status  Not Met        PT Long Term Goals - 08/14/17 1630      PT LONG TERM GOAL #1   Title  Pt to demo decreased pain in neck, to 0-1/10 with work duties    Baseline  6/10 after work    Time  6    Period  Weeks    Status  Not Met      PT LONG TERM GOAL #2   Title  Pt to demo improved cervical ROM to be WNL and pain free, to improve ability for ADLS and work duties     Baseline  no pain but still with mild limitation    Status  Not Met      PT LONG TERM GOAL #3   Title  Pt to demo independence with final HEP for posture and strengthening     Status  Achieved      PT LONG TERM GOAL #4   Title  Pt to demo soft tissue restrictions in L UT to be [facility] , to improve pain     Status  Achieved            Plan - 08/14/17 1700    Clinical Impression Statement  Pt feels 40% improved.  She has demonstrated improvements in ability to manage her pain and muscle spasms and has met most  goals, plateaued on pain goals at this time.  Pt will discharge from PT with HEP    PT Treatment/Interventions  ADLs/Self Care Home Management;Cryotherapy;Electrical Stimulation;Iontophoresis 21m/ml Dexamethasone;Moist Heat;Therapeutic exercise;Therapeutic activities;Functional mobility training;Ultrasound;Neuromuscular re-education;Patient/family education;Manual techniques;Taping;Dry needling;Passive range of motion    PT Next Visit Plan  discharged    Consulted and Agree with Plan of Care  Patient       Patient will benefit from skilled therapeutic intervention in order to improve the following deficits and impairments:  Decreased strength, Pain, Increased muscle spasms, Decreased range of motion, Improper body mechanics, Impaired flexibility, Hypermobility  Visit Diagnosis: Cervicalgia     Problem List Patient Active Problem List   Diagnosis Date Noted  . Anterior tibialis tendinitis of left leg 01/16/2017  . Osteoarthritis of spine with radiculopathy, cervical region 01/16/2017  . 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 Osteoarthritis of spine 10/30/2016  . Vitamin D deficiency 10/02/2016  . Pain in both knees 09/12/2016    JZannie Cove PT 08/14/2017, 5:10 PM  [facility] [address] [place] [address]: [phone]  Fax:  [phone] Name: Natasha EDINGERMRN: [id number] of Birth: 917-Jul-1972  PHYSICAL THERAPY DISCHARGE SUMMARY  Visits from Start of Care: 6  Current functional level related to goals / functional outcomes: See above goals   Remaining deficits: See above goals   Education / Equipment: HEP  Plan: Patient agrees to discharge.  Patient goals were partially met. Patient is being discharged due to being pleased with the current functional level.  ?????     JGoogle PT 08/14/17 5:12 PM

## 2017-08-14 NOTE — Patient Instructions (Signed)
Access Code: [email or web address]  URL: [email or web address]  Date: 08/14/2017  Prepared by: Dorie Rank   Exercises  Supine Transversus Abdominis Bracing - Hands on Thighs - 10 reps - 3 sets - 5 sec hold - 3x daily - 7x weekly  Supine Transversus Abdominis Bracing with Heel Slide - 10 reps - 2 sets - 1x daily - 7x weekly  Standing Shoulder Row with Anchored Resistance - 10 reps - 2 sets - 1x daily - 7x weekly  Shoulder Extension with Resistance - Neutral - 10 reps - 2 sets - 1x daily - 7x weekly  Beginner Bridge - 10 reps - 2 sets - 3 sec hold - 1x daily - 7x weekly  Hooklying Single Knee to Chest Stretch - 3 reps - 1 sets - 10 sec hold hold - 1x daily - 7x weekly  Sidelying Open Book Thoracic Rotation with Knee on Foam Roll - 10 reps - 1 sets - 2x daily - 7x weekly  Cat-Camel - 10 reps - 1 sets - 5 hold - 2x daily - 7x weekly  Child's Pose Stretch - 10 reps - 3 sets - 1x daily - 7x weekly  Child's Pose with Sidebending - 10 reps - 3 sets - 1x daily - 7x weekly  Shoulder External Rotation with Resistance - 10 reps - 3 sets - 1x daily - 7x weekly

## 2017-10-02 ENCOUNTER — Ambulatory Visit (INDEPENDENT_AMBULATORY_CARE_PROVIDER_SITE_OTHER): Payer: 59 | Admitting: Sports Medicine

## 2017-10-02 ENCOUNTER — Ambulatory Visit: Payer: Self-pay

## 2017-10-02 ENCOUNTER — Encounter: Payer: Self-pay | Admitting: Sports Medicine

## 2017-10-02 VITALS — BP 110/82 | HR 87 | Ht 67.0 in | Wt 209.6 lb

## 2017-10-02 DIAGNOSIS — S83221A Peripheral tear of medial meniscus, current injury, right knee, initial encounter: Secondary | ICD-10-CM | POA: Diagnosis not present

## 2017-10-02 DIAGNOSIS — M25561 Pain in right knee: Secondary | ICD-10-CM | POA: Diagnosis not present

## 2017-10-02 NOTE — Progress Notes (Signed)

## 2017-10-02 NOTE — Progress Notes (Signed)
Natasha Murphy. [facility] [phone]  Natasha Murphy - 47 y.o. female MRN [id number]  Date of birth: 01/03/1971  Visit Date: 10/02/2017  PCP: Helane Rima, DO   Referred by: Helane Rima, DO  Scribe(s) for today's visit: Stevenson Clinch, CMA  SUBJECTIVE:  Natasha Murphy is here for No chief complaint on file.   09/11/2016 (B knee pain): Pt presents today with complaint of tingling sensation in both knees.  Pt c/o intermittent tingling in both knees.  Pt reports that left knee cap popped out of place about 6 years ago The pain is described as tingling sensation with some weakness when bearing weight. She reports no problems with extending or bending. Improves with resting legs.  Therapies tried include : Pt did PT at [facility] 6 years ago.  Other associated symptoms include: Pt reports some swelling in her ankles. They tend to swell mostly when she travels but also has occasional swelling.  Pt denies fever, chills but does c/o night sweats.   10/02/2017 (R knee pain): Pain has started flaring up over the past few days. Pain is deep to the patella and slightly toward the medial aspect of the knee. She has noticed some swelling around the patella. She has hx of dislocated patella in R knee about 7 yrs ago. She denies clicking and popping in the knee. She c/o weakness in the R leg since the pain started.  Pain is worse with weight bearing and extension.  Compared to the last office visit, her previously described symptoms are improving, she was able to leave work early yesterday and rest her knee and keep her keep elevated.  Current symptoms are moderate & are nonradiating She has not taken any OTC meds or used other modalities for the pain at this time.    REVIEW OF SYSTEMS: Denies night time disturbances. Denies fevers, chills, or night sweats. Denies unexplained weight loss. Denies personal history of  cancer. Denies changes in bowel or bladder habits. Denies recent unreported falls. Denies new or worsening dyspnea or wheezing. Denies headaches or dizziness.  Reports weakness in R leg, tingling in L foot Denies dizziness or presyncopal episodes Reports lower extremity edema    HISTORY & PERTINENT PRIOR DATA:  Prior History reviewed and updated per electronic medical record.  Significant/pertinent history, findings, studies include:  reports that she has never smoked. She has never used smokeless tobacco. No results for input(s): HGBA1C, LABURIC, CREATINE in the last 8760 hours. No specialty comments available. No problems updated.  OBJECTIVE:  VS:  HT:5\' 7"  (170.2 cm)   WT:209 lb 9.6 oz (95.1 kg)  BMI:32.82    BP:110/82  HR:87bpm  TEMP: ( )  RESP:98 %   PHYSICAL EXAM: Constitutional: WDWN, Non-toxic appearing. Psychiatric: Alert & appropriately interactive.  Not depressed or anxious appearing. Respiratory: No increased work of breathing.  Trachea Midline Eyes: Pupils are equal.  EOM intact without nystagmus.  No scleral icterus  Vascular Exam: warm to touch no edema  lower extremity neuro exam: unremarkable normal strength normal sensation normal reflexes  MSK Exam: Right knee overall well aligned without significant malalignment.  Ligamentously stable.  Mild pain with palpation of the medial joint line.  Slight pain with McMurray's.   ASSESSMENT & PLAN:   1. Right knee pain, unspecified chronicity   2. Peripheral tear of medial meniscus of right knee as current injury, initial encounter     PLAN: Continue with topical  Pennsaid previously prescribed.  Small bulging medial meniscal tear.  Discussed the option for injection if any lack of improvement.  Follow-up: Return if symptoms worsen or fail to improve.      Please see additional documentation for Objective, Assessment and Plan sections. Pertinent additional documentation may be included in corresponding  procedure notes, imaging studies, problem based documentation and patient instructions. Please see these sections of the encounter for additional information regarding this visit.  CMA/ATC served as Neurosurgeonscribe during this visit. History, Physical, and Plan performed by medical provider. Documentation and orders reviewed and attested to.      Andrena MewsMichael D Dreon Pineda, DO    [place] [facility]

## 2017-10-02 NOTE — Procedures (Signed)
LIMITED MSK ULTRASOUND OF Right knee Images were obtained and interpreted by myself, Gaspar BiddingMichael Rigby, DO  Images have been saved and stored to PACS system. Images obtained on: GE S7 Ultrasound machine  FINDINGS:  Patella & Patellar Tendon: Normal Quad & Quad Tendon: Normal Suprapatellar Pouch: Normal, no effusion Medial Joint Line: Medial meniscus.  Small hypoechoic changes just superficial to the meniscal root. Lateral Joint Line: Normal lateral meniscus Trochlea: Normal appearing cartilage Posterior knee: n/a  IMPRESSION:  1. Degenerative medial meniscus 2. mild OA

## 2017-10-02 NOTE — Patient Instructions (Signed)
Please perform the exercise program that we have prepared for you and gone over in detail on a daily basis.  In addition to the handout you were provided you can access your program through: [email or web address]   Your unique program code is: [email or web address]

## 2017-10-03 ENCOUNTER — Ambulatory Visit: Payer: 59 | Admitting: Sports Medicine

## 2017-11-12 ENCOUNTER — Other Ambulatory Visit: Payer: Self-pay | Admitting: Obstetrics and Gynecology

## 2017-11-12 ENCOUNTER — Other Ambulatory Visit (HOSPITAL_COMMUNITY)
Admission: RE | Admit: 2017-11-12 | Discharge: 2017-11-12 | Disposition: A | Discharge status: Home / Self Care | Payer: 59 | Source: Ambulatory Visit | Attending: Obstetrics and Gynecology | Admitting: Obstetrics and Gynecology

## 2017-11-12 DIAGNOSIS — Z01411 Encounter for gynecological examination (general) (routine) with abnormal findings: Secondary | ICD-10-CM | POA: Diagnosis present

## 2017-11-13 LAB — CYTOLOGY - PAP: Diagnosis: NEGATIVE

## 2017-11-20 ENCOUNTER — Other Ambulatory Visit: Payer: Self-pay | Admitting: Internal Medicine

## 2017-11-20 DIAGNOSIS — Z1231 Encounter for screening mammogram for malignant neoplasm of breast: Secondary | ICD-10-CM

## 2017-12-16 ENCOUNTER — Ambulatory Visit
Admission: RE | Admit: 2017-12-16 | Discharge: 2017-12-16 | Disposition: A | Discharge status: Home / Self Care | Payer: 59 | Source: Ambulatory Visit | Attending: Internal Medicine | Admitting: Internal Medicine

## 2017-12-16 DIAGNOSIS — Z1231 Encounter for screening mammogram for malignant neoplasm of breast: Secondary | ICD-10-CM

## 2017-12-17 ENCOUNTER — Other Ambulatory Visit: Payer: Self-pay | Admitting: Internal Medicine

## 2017-12-17 ENCOUNTER — Ambulatory Visit: Payer: 59

## 2017-12-17 DIAGNOSIS — R928 Other abnormal and inconclusive findings on diagnostic imaging of breast: Secondary | ICD-10-CM

## 2017-12-18 ENCOUNTER — Ambulatory Visit
Admission: RE | Admit: 2017-12-18 | Discharge: 2017-12-18 | Disposition: A | Discharge status: Home / Self Care | Payer: 59 | Source: Ambulatory Visit | Attending: Internal Medicine | Admitting: Internal Medicine

## 2017-12-18 DIAGNOSIS — R928 Other abnormal and inconclusive findings on diagnostic imaging of breast: Secondary | ICD-10-CM

## 2017-12-27 ENCOUNTER — Ambulatory Visit: Payer: 59 | Admitting: Sports Medicine

## 2018-01-03 ENCOUNTER — Ambulatory Visit: Payer: 59

## 2019-01-30 IMAGING — MR MR CERVICAL SPINE W/O CM
4 of 6 series · 23 of 48 positions shown · non-contrast
Comparison: Plain film cervical spine 10/30/2016.

CLINICAL DATA: Neck pain and stiffness for 4 years. No known
injury.

EXAM:
MRI CERVICAL SPINE WITHOUT CONTRAST
TECHNIQUE: Multiplanar, multisequence MR imaging of the cervical spine was
performed. No intravenous contrast was administered.

[Series 3: T2 post-contrast · sagittal · 3.5mm · 0.35mm/px · 4 of 13 slices shown]
[im 1/13]
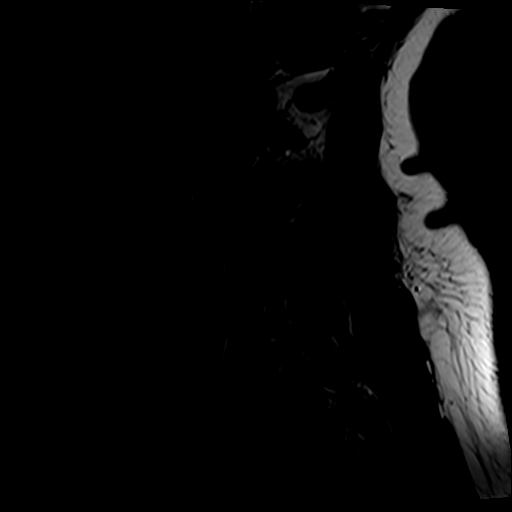
[im 5/13]
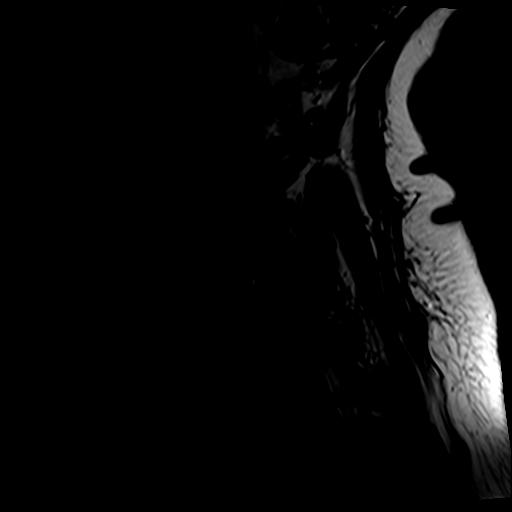
[im 9/13]
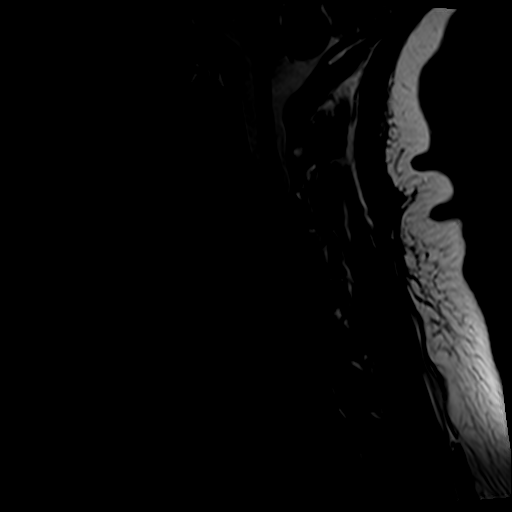
[im 13/13]
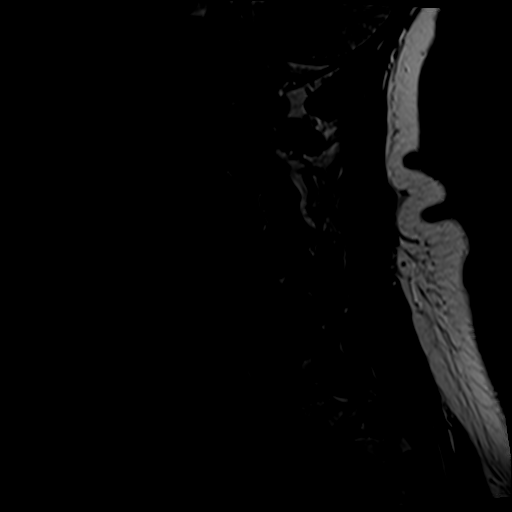

[Series 4: T1 · sagittal · 3.5mm · 0.35mm/px · 4 of 13 slices shown (1 of 2)]
[im 1/13]
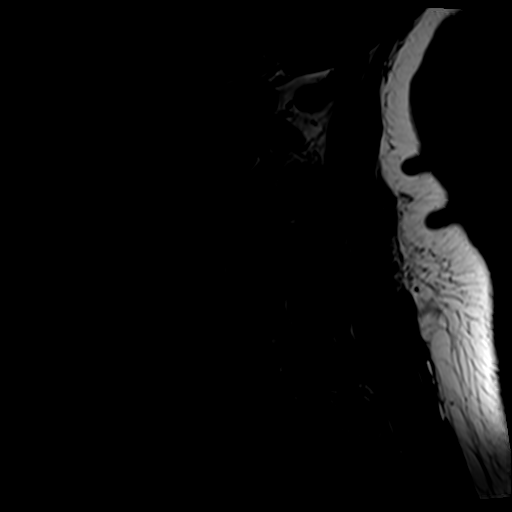
[im 5/13]
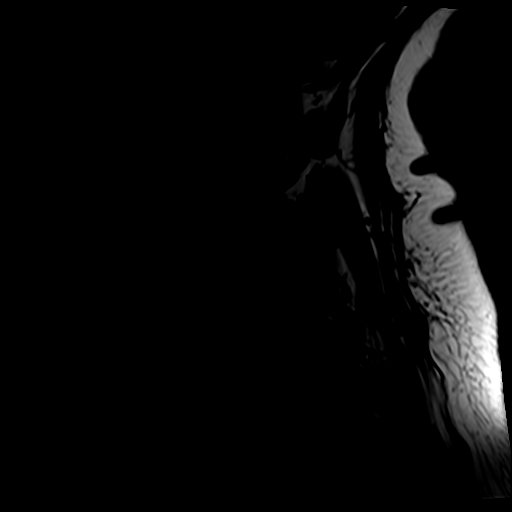
[im 9/13]
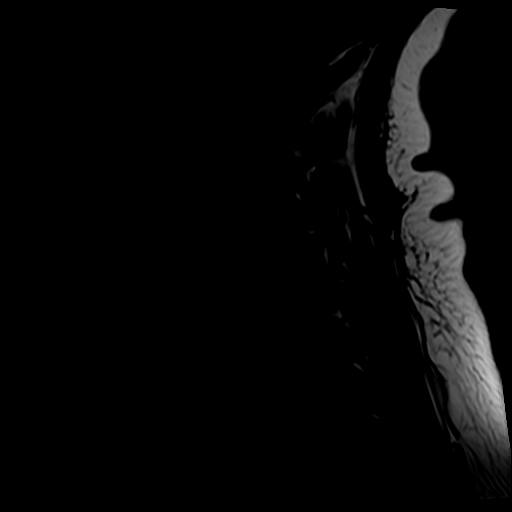
[im 13/13]
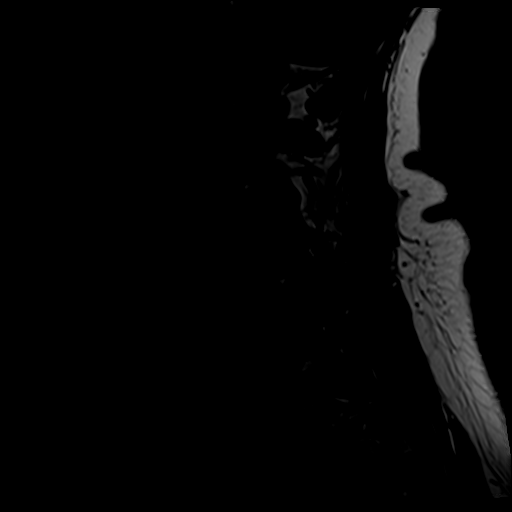

[Series 6: T2 · axial · 3.0mm · 0.39mm/px · z∈[-92,+20]mm · 9 of 34 slices shown]
[im 1/34]
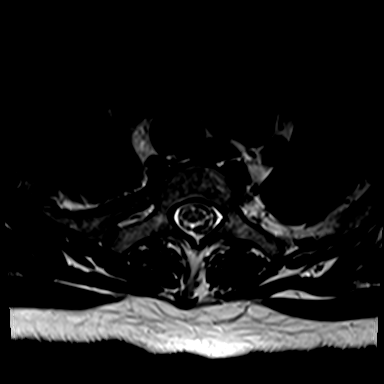
[im 7/34]
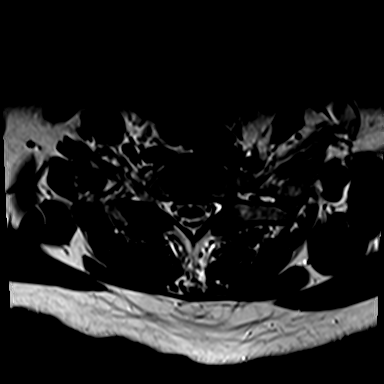
[im 10/34]
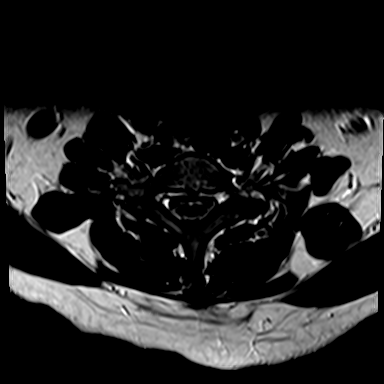
[im 16/34]
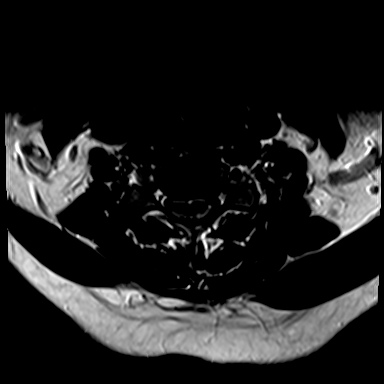
[im 19/34]
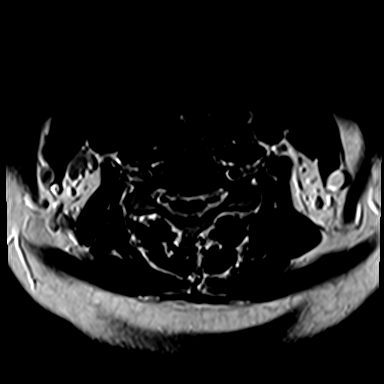
[im 25/34]
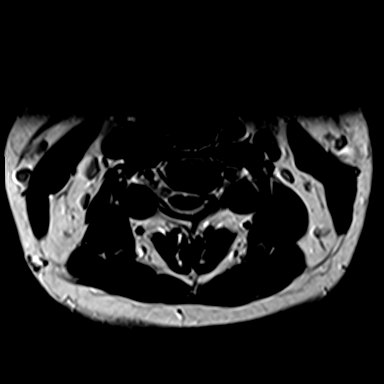
[im 28/34]
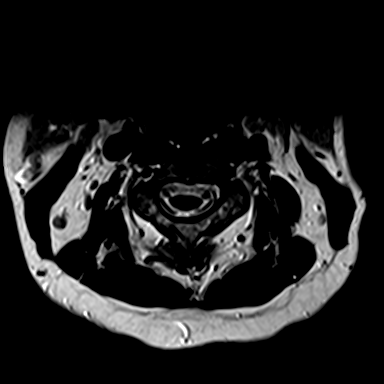
[im 31/34]
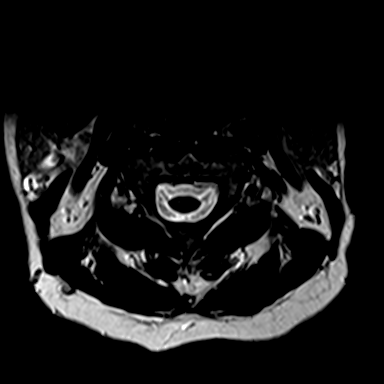
[im 34/34]
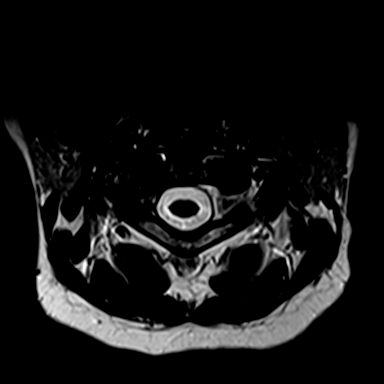

[Series 8: T1 · axial · 3.0mm · 0.35mm/px · z∈[-95,+7]mm · 6 of 34 slices shown (2 of 2)]
[im 1/34]
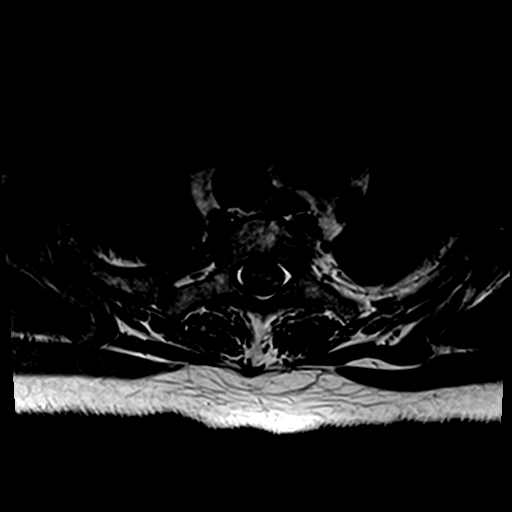
[im 7/34]
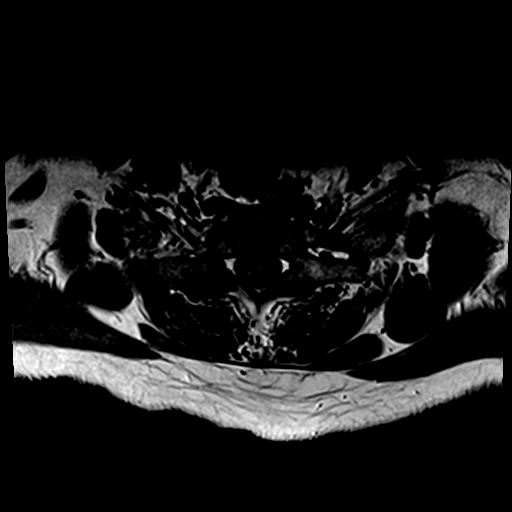
[im 10/34]
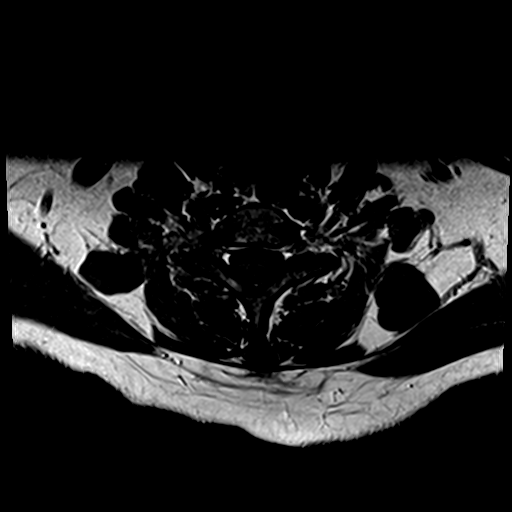
[im 16/34]
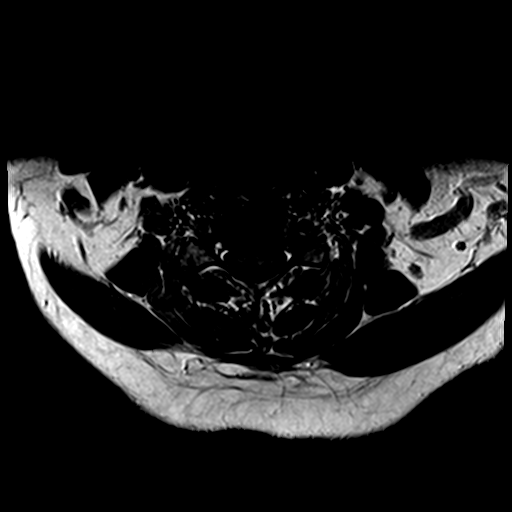
[im 19/34]
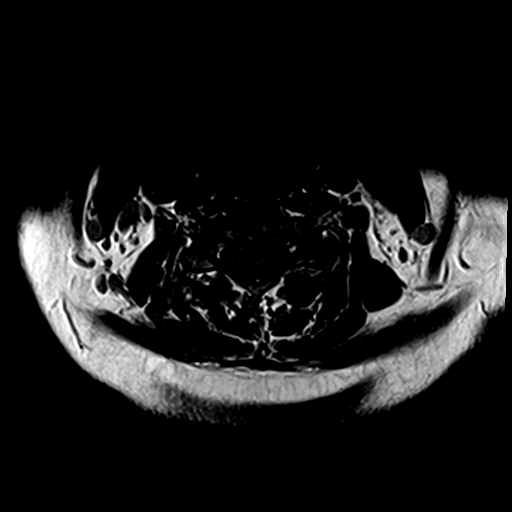
[im 31/34]
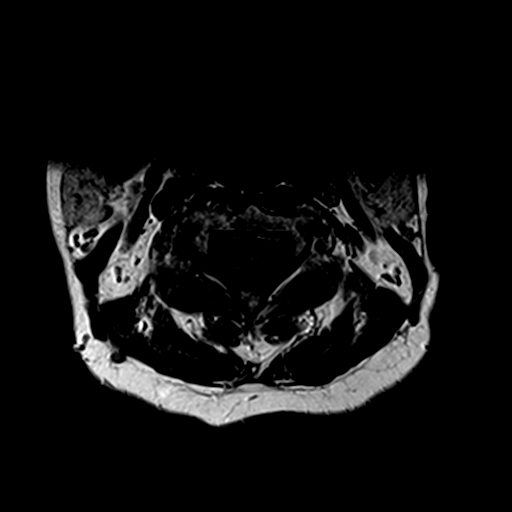

[23 of 48 positions shown; findings below may reference images not displayed]

FINDINGS: Alignment: There is straightening of the normal cervical lordosis.

Vertebrae: No fracture or worrisome lesion. There is partial
congenital fusion across the C2-3 disc interspace and the facet
joints are fused consistent with Puria deformity.

Cord: Normal signal throughout.

Posterior Fossa, vertebral arteries, paraspinal tissues: Negative.

Disc levels:

C2-3: Congenitally fused. The central canal and foramina are widely
patent.

C3-4: Shallow left paracentral protrusion without central canal or
foraminal stenosis.

C4-5: There is some posterior bony ridging and uncovertebral
disease. The ventral thecal sac is effaced. The right foramen is
open. Mild left foraminal narrowing is noted.

C5-6: Disc osteophyte complex and uncovertebral disease are seen.
The ventral thecal sac is effaced and there is mild flattening of
the right cord. The foramina are open.

C6-7: Shallow disc bulge and uncovertebral disease are identified.
The central canal is open. Mild foraminal narrowing is more notable
on the left.

C7-T1:  Mild facet degenerative change.  Otherwise negative.

T1-2:  Negative.
IMPRESSION: Congenital fusion of C2-3 consistent with Puria deformity.
Please correlate with plain films if any intervention is planned.

Disc osteophyte complex at C4-5 effaces the ventral thecal sac.
There is also mild left foraminal narrowing at this level.

Disc osteophyte complex eccentric to the right at C5-6 mildly deform
the right hemicord. The foramina are open.

## 2019-03-30 ENCOUNTER — Ambulatory Visit: Payer: 59 | Admitting: Skilled Nursing Facility1

## 2019-04-07 ENCOUNTER — Encounter: Payer: Self-pay | Admitting: Registered"

## 2019-04-07 ENCOUNTER — Encounter: Payer: 59 | Attending: Obstetrics and Gynecology | Admitting: Registered"

## 2019-04-07 ENCOUNTER — Other Ambulatory Visit: Payer: Self-pay

## 2019-04-07 DIAGNOSIS — R7303 Prediabetes: Secondary | ICD-10-CM | POA: Insufficient documentation

## 2019-04-07 NOTE — Progress Notes (Signed)
  Medical Nutrition Therapy:  Appt start time: 8:08 end time:  9:14.   Assessment:  Primary concerns today: Per referral, recent A1c was 5.7 (02/2019).   Pt expectations: none stated   Pt states its always a right now with her. States she tries to do good and falls off and it becomes a vicious cycle for her. States she is currently not eating after 6-7 pm because she has been having bad cases of heartburn. States she typically goes to bed 10-11 pm and wakes up 6:30 am. States she would eat a small portion of chicken breast, rice, broccoli in the evening with husband and it would give her heartburn. Pt states she has been trying to reduce food intake to lose weight. Reports sipping on morning smoothie from 9-11 am.   Pt reports current work schedule is 9-6pm and will be changing to 9:30-6:30pm soon. States she will be working from home.   Reports love/hate relationship with physical activity. States she was walking with husband during warmer months for 60 min; enjoyed it and looking forward to warm weather again to resume walking regimen. States she has workout videos at home she can do in the mornings prior to beginning workday.    Preferred Learning Style:   No preference indicated   Learning Readiness:   Ready  Change in progress   MEDICATIONS: See list   DIETARY INTAKE:  Usual eating pattern includes 2 meals and 1-2 snacks per day.  Everyday foods include smoothie, peppermints, fruit, granola bar.  Avoided foods include none stated.    24-hr recall:  B (9-11 AM): smoothie (1/4 avocado, handful of kale, spinach, blueberries, banana, orange juice/apple juice) on workdays Snk (12 PM): Nature bar  L (2-3 PM): 2 slices of frozen supreme pizza Snk ( PM): soft peppermints or celery + hummus D ( PM): skipped  Snk ( PM):  Beverages: smoothie, water (32 oz), alcohol (liquor/soda)  Usual physical activity: cardio 45-60 min (inconsistently)  Estimated energy needs: 1800-2000  calories 200-225 g carbohydrates 135-150 g protein 50-56 g fat  Progress Towards Goal(s):  In progress.   Nutritional Diagnosis:  NB-1.1 Food and nutrition-related knowledge deficit As related to prediabetes.  As evidenced by verablizes incomplete information.    Intervention:  Nutrition education and counseling. Pt was educated and counseled on prediabetes, how carbohydrates work in the body, A1c ranges for prediabetes, role of fiber, ways to increase fiber intake, and importance of physical activity. Discussed meal/snack planning and how to balance meals/snacks using MyPlate for prediabetes. Pt was in agreement with goals listed. Goals: - Aim to eat breakfast within 1-2 hours of waking up - Try not to go longer than 5 hours without eating - Increase fiber intake by having 1/2 plate non-starchy vegetables with lunch and dinner, eating whole grain items, and including nuts in daily regimen.  - Add protein to breakfast. - Replace juice in smoothie with water or greek yogurt.   Teaching Method Utilized:  Visual Auditory Hands on  Handouts given during visit include:  My Plate for prediabetes  Barriers to learning/adherence to lifestyle change: none identified  Demonstrated degree of understanding via:  Teach Back   Monitoring/Evaluation:  Dietary intake, exercise, and body weight in 1 month(s).

## 2019-04-07 NOTE — Patient Instructions (Signed)
-   Aim to eat breakfast within 1-2 hours of waking up  - Try not to go longer than 5 hours without eating  - Increase fiber intake by having 1/2 plate non-starchy vegetables with lunch and dinner, eating whole grain items, and including nuts in daily regimen.   - Add protein to breakfast.  - Replace juice in smoothie with water or greek yogurt.

## 2019-04-21 ENCOUNTER — Ambulatory Visit: Payer: BC Managed Care – PPO | Admitting: Dietician

## 2019-04-24 ENCOUNTER — Other Ambulatory Visit: Payer: Self-pay | Admitting: Internal Medicine

## 2019-04-24 DIAGNOSIS — Z1231 Encounter for screening mammogram for malignant neoplasm of breast: Secondary | ICD-10-CM

## 2019-05-07 ENCOUNTER — Ambulatory Visit: Payer: BC Managed Care – PPO | Admitting: Registered"

## 2019-05-22 ENCOUNTER — Other Ambulatory Visit: Payer: Self-pay | Admitting: Internal Medicine

## 2019-05-22 DIAGNOSIS — K219 Gastro-esophageal reflux disease without esophagitis: Secondary | ICD-10-CM

## 2019-05-27 ENCOUNTER — Other Ambulatory Visit: Payer: Self-pay

## 2019-05-27 ENCOUNTER — Ambulatory Visit
Admission: RE | Admit: 2019-05-27 | Discharge: 2019-05-27 | Disposition: A | Discharge status: Home / Self Care | Payer: BC Managed Care – PPO | Source: Ambulatory Visit | Attending: Internal Medicine | Admitting: Internal Medicine

## 2019-05-27 DIAGNOSIS — Z1231 Encounter for screening mammogram for malignant neoplasm of breast: Secondary | ICD-10-CM

## 2019-06-01 ENCOUNTER — Ambulatory Visit
Admission: RE | Admit: 2019-06-01 | Discharge: 2019-06-01 | Disposition: A | Discharge status: Home / Self Care | Payer: BC Managed Care – PPO | Source: Ambulatory Visit | Attending: Internal Medicine | Admitting: Internal Medicine

## 2019-06-01 DIAGNOSIS — K219 Gastro-esophageal reflux disease without esophagitis: Secondary | ICD-10-CM

## 2020-04-18 ENCOUNTER — Other Ambulatory Visit: Payer: Self-pay | Admitting: Internal Medicine

## 2020-04-18 DIAGNOSIS — Z1231 Encounter for screening mammogram for malignant neoplasm of breast: Secondary | ICD-10-CM

## 2020-05-28 ENCOUNTER — Ambulatory Visit
Admission: RE | Admit: 2020-05-28 | Discharge: 2020-05-28 | Disposition: A | Discharge status: Home / Self Care | Payer: BC Managed Care – PPO | Source: Ambulatory Visit | Attending: Internal Medicine | Admitting: Internal Medicine

## 2020-05-28 ENCOUNTER — Other Ambulatory Visit: Payer: Self-pay

## 2020-05-28 DIAGNOSIS — Z1231 Encounter for screening mammogram for malignant neoplasm of breast: Secondary | ICD-10-CM

## 2020-05-30 ENCOUNTER — Inpatient Hospital Stay: Admission: RE | Admit: 2020-05-30 | Payer: BC Managed Care – PPO | Source: Ambulatory Visit

## 2020-06-03 ENCOUNTER — Other Ambulatory Visit: Payer: Self-pay | Admitting: Internal Medicine

## 2020-06-03 DIAGNOSIS — R928 Other abnormal and inconclusive findings on diagnostic imaging of breast: Secondary | ICD-10-CM

## 2020-06-16 ENCOUNTER — Other Ambulatory Visit: Payer: Self-pay | Admitting: Obstetrics and Gynecology

## 2020-06-16 DIAGNOSIS — R928 Other abnormal and inconclusive findings on diagnostic imaging of breast: Secondary | ICD-10-CM

## 2020-06-17 ENCOUNTER — Other Ambulatory Visit: Payer: Self-pay

## 2020-06-17 ENCOUNTER — Ambulatory Visit
Admission: RE | Admit: 2020-06-17 | Discharge: 2020-06-17 | Disposition: A | Discharge status: Home / Self Care | Payer: BC Managed Care – PPO | Source: Ambulatory Visit | Attending: Internal Medicine | Admitting: Internal Medicine

## 2020-06-17 DIAGNOSIS — R928 Other abnormal and inconclusive findings on diagnostic imaging of breast: Secondary | ICD-10-CM

## 2021-07-19 ENCOUNTER — Other Ambulatory Visit: Payer: Self-pay | Admitting: Internal Medicine

## 2021-07-19 DIAGNOSIS — Z1231 Encounter for screening mammogram for malignant neoplasm of breast: Secondary | ICD-10-CM

## 2021-07-20 DIAGNOSIS — Z1231 Encounter for screening mammogram for malignant neoplasm of breast: Secondary | ICD-10-CM

## 2021-07-25 ENCOUNTER — Other Ambulatory Visit: Payer: Self-pay | Admitting: Internal Medicine

## 2021-07-25 DIAGNOSIS — Z1231 Encounter for screening mammogram for malignant neoplasm of breast: Secondary | ICD-10-CM

## 2021-07-27 ENCOUNTER — Ambulatory Visit
Admission: RE | Admit: 2021-07-27 | Discharge: 2021-07-27 | Disposition: A | Discharge status: Home / Self Care | Payer: BC Managed Care – PPO | Source: Ambulatory Visit

## 2021-07-27 DIAGNOSIS — Z1231 Encounter for screening mammogram for malignant neoplasm of breast: Secondary | ICD-10-CM

## 2021-07-31 ENCOUNTER — Other Ambulatory Visit: Payer: Self-pay | Admitting: Internal Medicine

## 2021-07-31 DIAGNOSIS — R928 Other abnormal and inconclusive findings on diagnostic imaging of breast: Secondary | ICD-10-CM

## 2021-08-10 ENCOUNTER — Ambulatory Visit
Admission: RE | Admit: 2021-08-10 | Discharge: 2021-08-10 | Disposition: A | Discharge status: Home / Self Care | Payer: BC Managed Care – PPO | Source: Ambulatory Visit | Attending: Internal Medicine | Admitting: Internal Medicine

## 2021-08-10 DIAGNOSIS — R928 Other abnormal and inconclusive findings on diagnostic imaging of breast: Secondary | ICD-10-CM

## 2021-08-30 ENCOUNTER — Ambulatory Visit: Payer: BC Managed Care – PPO | Admitting: Family Medicine

## 2021-09-06 ENCOUNTER — Ambulatory Visit: Payer: BC Managed Care – PPO | Admitting: Family Medicine

## 2021-09-18 ENCOUNTER — Encounter: Payer: Self-pay | Admitting: Family Medicine

## 2021-09-18 ENCOUNTER — Ambulatory Visit (INDEPENDENT_AMBULATORY_CARE_PROVIDER_SITE_OTHER): Payer: BC Managed Care – PPO | Admitting: Family Medicine

## 2021-09-18 VITALS — BP 122/70 | HR 73 | Temp 98.2°F | Ht 67.75 in | Wt 223.1 lb

## 2021-09-18 DIAGNOSIS — R600 Localized edema: Secondary | ICD-10-CM

## 2021-09-18 DIAGNOSIS — M5412 Radiculopathy, cervical region: Secondary | ICD-10-CM | POA: Diagnosis not present

## 2021-09-18 DIAGNOSIS — R002 Palpitations: Secondary | ICD-10-CM | POA: Diagnosis not present

## 2021-09-18 NOTE — Patient Instructions (Addendum)
Welcome to [facility]! It was a pleasure meeting you today.  As discussed, Please schedule a 9 month follow up visit today.  Limit salt.  Wear  compression stockings.  Drink plenty of water with lemon.  Referring PT and Cardiology    PLEASE NOTE:  If you had any LAB tests please let us know if you have not heard back within a few days. You may see your results on MyChart before we have a chance to review them but we will give you a call once they are reviewed by [place]. If we ordered any REFERRALS today, please let us know if you have not heard from their office within the next week.  Let us know through MyChart if you are needing REFILLS, or have your pharmacy send [place] the request. You can also use MyChart to communicate with me or any office staff.  Please try these tips to maintain a healthy lifestyle:  Eat most of your calories during the day when you are active. Eliminate processed foods including packaged sweets (pies, cakes, cookies), reduce intake of potatoes, white bread, white pasta, and white rice. Look for whole grain options, oat flour or almond flour.  Each meal should contain half fruits/vegetables, one quarter protein, and one quarter carbs (no bigger than a computer mouse).  Cut down on sweet beverages. This includes juice, soda, and sweet tea. Also watch fruit intake, though this is a healthier sweet option, it still contains natural sugar! Limit to 3 servings daily.  Drink at least 1 glass of water with each meal and aim for at least 8 glasses per day  Exercise at least 150 minutes every week.

## 2021-09-18 NOTE — Progress Notes (Signed)
New Patient Office Visit  Subjective:  Patient ID: Natasha Murphy, female    DOB: [birth year]  Age: 51 y.o. MRN: [id number]  CC:  Chief Complaint  Patient presents with   Establish Care    Need new PCP discuss past issues     HPI COLLEEN KOTLARZ presents for new pt.  Had cpx-[facility].   Pap-sees gyn.   11/12/17 Cscope 2019-polyps.  Neck pain-occ numbness.  some swelling L neck.  Has some numbness intermitt Lhand/foot(pinkie). Going on about 2 yrs   Pt ? If blood flow. Some swelling L neck.  PT in past-dry needling.   "Fused vertebrae" seen on MRI-no surgery ever.  B ankle swelling for 2 yrs, intermitt-more when travels.  Does wear compression socks for travel.  No sob.  Water pill in past-dehydrated. Concerned about heart. Palpitations-intermitt for months.  Can wake her up. ?anxiety, husb problems, work.  No symptoms w/it.  Last episode around April.  Caffeine none.  EKG 2019.  Stress-seeing Dr. Rae Mar LOA from work.    Past Medical History:  Diagnosis Date   Depression 2015   GERD (gastroesophageal reflux disease) 2020   Heart murmur 1995   HSV-2 seropositive    Hx of chlamydia infection 1995, 01/2009   Hx of gonorrhea 1995   Lactose intolerance    Varicose veins    Vitamin D deficiency     Past Surgical History:  Procedure Laterality Date   CESAREAN SECTION WITH BILATERAL TUBAL LIGATION     UMBILICAL HERNIA REPAIR  age 63    Family History  Problem Relation Age of Onset   Hypertension Mother    Depression Mother    Anxiety disorder Mother    Cancer Paternal Aunt    Cancer Paternal Grandmother    Breast cancer Neg Hx     Social History   Socioeconomic History   Marital status: Married    Spouse name: Not on file   Number of children: 3   Years of education: Not on file   Highest education level: Not on file  Occupational History   Not on file  Tobacco Use   Smoking status: Never   Smokeless tobacco: Never  Vaping Use   Vaping Use: Never used   Substance and Sexual Activity   Alcohol use: Yes    Comment: occassional (twice/month)   Drug use: No   Sexual activity: Yes  Other Topics Concern   Not on file  Social History Narrative   Step 3 and 3 of hers.  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   Social Determinants of Health   Financial Resource Strain: Not on file  Food Insecurity: Not on file  Transportation Needs: Not on file  Physical Activity: Not on file  Stress: Not on file  Social Connections: Not on file  Intimate Partner Violence: Not on file    ROS  No ha/dizziness No cp/sob  Objective:   Today's Vitals: BP 122/70   Pulse 73   Temp 98.2 F (36.8 C) (Temporal)   Ht 5' 7.75" (1.721 m)   Wt 223 lb 2 oz (101.2 kg)   LMP 07/12/2021 (Approximate)   SpO2 96%   BMI 34.18 kg/m   Physical Exam  Gen: WDWN NAD OAAF HEENT: NCAT, conjunctiva not injected, sclera nonicteric ?fat pad L lateral neck-no bruit NECK:  supple, no thyromegaly, no nodes, no carotid bruits CARDIAC: RRR, S1S2+, no murmur. DP 2+B LUNGS: CTAB. No wheezes ABDOMEN:  BS+, soft, NTND, No  HSM, no masses EXT:  no edema MSK: no gross abnormalities. Ms 5/5 all 4 NEURO: A&O x3.  CN II-XII intact.  PSYCH: normal mood. Good eye contact   Reviewed labs from annual-Dr. Polite 06/22/21-unremarkable x HDL 39 and trigs 182.   LDL 126  Assessment & Plan:   Problem List Items Addressed This Visit   None Visit Diagnoses     Cervical radiculopathy    -  Primary   Relevant Medications   traZODone (DESYREL) 50 MG tablet   sertraline (ZOLOFT) 50 MG tablet   Other Relevant Orders   Ambulatory referral to Physical Therapy   Palpitations       Relevant Orders   Ambulatory referral to Cardiology   Local edema       Relevant Orders   Ambulatory referral to Cardiology      Cervical radiculopathy-chronic.  Some increased symptoms.  Pt willing to try PT again.  If worse, not improving, see ortho again.   Localized edema-intermitt.  Suspect venous insuff.   Diuretics-too dehydrated.  Cont compression stockings, inc water.  Try water w/Lemon.  Pt req referral to Card.  Palpitations-intermitt.  Advised to keep log.  Req referral card. Stress-seeing MH and meds per them.  Cont.  F/u cpx in 9 mo.    Outpatient Encounter Medications as of 09/18/2021  Medication Sig   acetaminophen (TYLENOL) 500 MG tablet Take 500-1,000 mg by mouth every 6 (six) hours as needed for moderate pain.   Ascorbic Acid (VITAMIN C) 500 MG CAPS See admin instructions.   cetirizine (ZYRTEC ALLERGY) 10 MG tablet Take 10 mg by mouth daily as needed for allergies.    Cholecalciferol (VITAMIN D) 2000 units CAPS Take 1 capsule by mouth daily.   ibuprofen (ADVIL) 600 MG tablet ibuprofen 600 mg tablet  TAKE 1 TABLET BY MOUTH EVERY 6 TO 8 HOURS AS NEEDED   pantoprazole (PROTONIX) 40 MG tablet TAKE 1 TABLET BY MOUTH EVERY DAY 30   sertraline (ZOLOFT) 50 MG tablet Take 50 mg by mouth daily.   traZODone (DESYREL) 50 MG tablet trazodone 50 mg tablet  TAKE 1 TABLET BY MOUTH AT BEDTIME   [DISCONTINUED] Cyanocobalamin (VITAMIN B-12 CR PO) Take 1 tablet by mouth daily.    [DISCONTINUED] Diclofenac Sodium (PENNSAID) 2 % SOLN Place 1 application onto the skin 2 (two) times daily. (Patient not taking: Reported on 10/02/2017)   No facility-administered encounter medications on file as of 09/18/2021.    Follow-up: Return in about 9 months (around 06/19/2022) for annual.   Angelena Sole, MD

## 2021-09-25 ENCOUNTER — Ambulatory Visit (INDEPENDENT_AMBULATORY_CARE_PROVIDER_SITE_OTHER): Payer: BC Managed Care – PPO | Admitting: Internal Medicine

## 2021-09-25 ENCOUNTER — Encounter: Payer: Self-pay | Admitting: Internal Medicine

## 2021-09-25 VITALS — BP 140/90 | HR 76 | Ht 67.0 in | Wt 224.6 lb

## 2021-09-25 DIAGNOSIS — R002 Palpitations: Secondary | ICD-10-CM | POA: Diagnosis not present

## 2021-09-25 NOTE — Progress Notes (Signed)
Cardiology Office Note:    Date:  09/25/2021   ID:  Natasha Murphy, DOB Oct 05, 1970, MRN [id number]  PCP:  Jeani Sow, MD   [facility] Providers Cardiologist:  None     Referring MD: Jeani Sow, MD   No chief complaint on file. Palpitations  History of Present Illness:    Natasha Murphy is a 51 y.o. female with a hx of GERD, referral for intermittent palpitations. She notes sensing them at night. Not every night. If she sleeps on her left sided she notices it more. She notes some heart racing. She denies LH, dizziness, syncope, caffeine intake, etoh use at night, family hx of SCD. She has no known structural heart dx. TSH is normal. She notes prior swelling in her legs, no symptoms or signs of CHF.   Past Medical History:  Diagnosis Date   Depression 2015   GERD (gastroesophageal reflux disease) 2020   Heart murmur 1995   HSV-2 seropositive    Hx of chlamydia infection 1995, 01/2009   Hx of gonorrhea 1995   Lactose intolerance    Varicose veins    Vitamin D deficiency     Past Surgical History:  Procedure Laterality Date   CESAREAN SECTION WITH BILATERAL TUBAL LIGATION     UMBILICAL HERNIA REPAIR  age 60    Current Medications: Current Meds  Medication Sig   Ascorbic Acid (VITAMIN C) 500 MG CAPS See admin instructions.   cetirizine (ZYRTEC ALLERGY) 10 MG tablet Take 10 mg by mouth daily as needed for allergies.    Cholecalciferol (VITAMIN D) 2000 units CAPS Take 1 capsule by mouth daily.   ibuprofen (ADVIL) 600 MG tablet ibuprofen 600 mg tablet  TAKE 1 TABLET BY MOUTH EVERY 6 TO 8 HOURS AS NEEDED   pantoprazole (PROTONIX) 40 MG tablet TAKE 1 TABLET BY MOUTH EVERY DAY 30   sertraline (ZOLOFT) 50 MG tablet Take 50 mg by mouth daily.   traZODone (DESYREL) 50 MG tablet trazodone 50 mg tablet  TAKE 1 TABLET BY MOUTH AT BEDTIME     Allergies:   Tramadol and Penicillins   Social History   Socioeconomic History   Marital status: Married    Spouse  name: Not on file   Number of children: 3   Years of education: Not on file   Highest education level: Not on file  Occupational History   Not on file  Tobacco Use   Smoking status: Never   Smokeless tobacco: Never  Vaping Use   Vaping Use: Never used  Substance and Sexual Activity   Alcohol use: Yes    Comment: occassional (twice/month)   Drug use: No   Sexual activity: Yes  Other Topics Concern   Not on file  Social History Narrative   Step 3 and 3 of hers.  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 There is no history of Breast cancer.  ROS:   Please see the history of present illness.     All other systems reviewed and are negative.  EKGs/Labs/Other Studies  Reviewed:    The following studies were reviewed today:   EKG:  EKG is  ordered today.  The ekg ordered today demonstrates   09/25/2021- NSR  Recent Labs: No results found for requested labs within last 365 days.  Recent Lipid Panel No results found for: "CHOL", "TRIG", "HDL", "CHOLHDL", "VLDL", "LDLCALC", "LDLDIRECT"   Risk Assessment/Calculations:           Physical Exam:    VS:    Vitals:   09/25/21 1548  BP: 140/90  Pulse: 76  SpO2: 99%     BP 140/90 (BP Location: Left Arm)   Pulse 76   Ht 5\' 7"  (1.702 m)   Wt 224 lb 9.6 oz (101.9 kg)   LMP 07/12/2021 (Approximate)   SpO2 99%   BMI 35.18 kg/m     Wt Readings from Last 3 Encounters:  09/25/21 224 lb 9.6 oz (101.9 kg)  09/18/21 223 lb 2 oz (101.2 kg)  10/02/17 209 lb 9.6 oz (95.1 kg)     GEN: Well nourished, well  developed in no acute distress HEENT: Normal NECK: No JVD; No carotid bruits LYMPHATICS: No lymphadenopathy CARDIAC: RRR, no murmurs, rubs, gallops RESPIRATORY:  Clear to auscultation without rales, wheezing or rhonchi  ABDOMEN: Soft, non-tender, non-distended MUSCULOSKELETAL:  No edema; No deformity  SKIN: Warm and dry NEUROLOGIC:  Alert and oriented x 3 PSYCHIATRIC:  Normal affect   ASSESSMENT:    Palpitations: She does not have high risk features including syncope c/f arrhythmia , family hx of SCD, or abnormalities on her EKG.  Can assess for any rhythm issues with a cardiac monitor. I recommended she hydrate well during the day with electrolytes. Also recommended if etoh at night makes it worse, she can cut back. Anxiety can also contribute, she is on therapy.  PLAN:    In order of problems listed above:  3 day zio patch Follow up pending results           Medication Adjustments/Labs and Tests Ordered: Current medicines are reviewed at length with the patient today.  Concerns regarding medicines are outlined above.  Orders Placed This Encounter  Procedures   LONG TERM MONITOR (3-14 DAYS)   EKG 12-Lead   No orders of the defined types were placed in this encounter.   Patient Instructions  Medication Instructions:  Your physician recommends that you continue on your current medications as directed. Please refer to the Current Medication list given to you today.  Testing/Procedures: 10/04/17- Long Term Monitor Instructions   Your physician has requested you wear a ZIO patch monitor for _3_ days.  This is a single patch monitor.   [employer] supplies one patch monitor per enrollment. Additional stickers are not available. Please do not apply patch if you will be having a Nuclear Stress Test, Echocardiogram, Cardiac CT, MRI, or Chest Xray during the period you would be wearing the monitor. The patch cannot be worn during these tests. You cannot remove and re-apply the ZIO XT  patch monitor.  Your ZIO patch monitor will be sent Fed Ex from [employer] directly to your home address. It may take 3-5 days to receive your monitor after you have been enrolled.  Once you have received your monitor, please review the enclosed instructions. Your monitor has already been registered assigning a specific monitor serial # to you.  Billing and Patient Assistance Program Information   We have supplied [employer] with any of your insurance information on file for billing purposes. [employer]  offers a sliding scale Patient Assistance Program for patients that do not have insurance, or whose insurance does not completely cover the cost of the ZIO monitor.   You must apply for the Patient Assistance Program to qualify for this discounted rate.     To apply, please call [employer] at [phone], select option 4, then select option 2, and ask to apply for Patient Assistance Program.  Meredeth Ide will ask your household income, and how many people are in your household.  They will quote your out-of-pocket cost based on that information.  [employer] will also be able to set up a 29-month, interest-free payment plan if needed.  Applying the monitor   Shave hair from upper left chest.  Hold abrader disc by orange tab. Rub abrader in 40 strokes over the upper left chest as indicated in your monitor instructions.  Clean area with 4 enclosed alcohol pads. Let dry.  Apply patch as indicated in monitor instructions. Patch will be placed under collarbone on left side of chest with arrow pointing upward.  Rub patch adhesive wings for 2 minutes. Remove white label marked "1". Remove the white label marked "2". Rub patch adhesive wings for 2 additional minutes.  While looking in a mirror, press and release button in center of patch. A small green light will flash 3-4 times. This will be your only indicator that the monitor has been turned on. ?  Do not shower for the first 24 hours. You may shower after the  first 24 hours.  Press the button if you feel a symptom. You will hear a small click. Record Date, Time and Symptom in the Patient Logbook.  When you are ready to remove the patch, follow instructions on the last 2 pages of the Patient Logbook. Stick patch monitor onto the last page of Patient Logbook.  Place Patient Logbook in the blue and white box.  Use locking tab on box and tape box closed securely.  The blue and white box has prepaid postage on it. Please place it in the mailbox as soon as possible. Your physician should have your test results approximately 7 days after the monitor has been mailed back to [facility].  Call [facility] Customer Care at [phone] if you have questions regarding your ZIO XT patch monitor. Call them immediately if you see an orange light blinking on your monitor.  If your monitor falls off in less than 4 days, contact our Monitor department at [phone]. ?If your monitor becomes loose or falls off after 4 days call [employer] at [phone] for suggestions on securing your monitor.?  Follow-Up: At [facility], you and your health needs are our priority.  As part of our continuing mission to provide you with exceptional heart care, we have created designated Provider Care Teams.  These Care Teams include your primary Cardiologist (physician) and Advanced Practice Providers (APPs -  Physician Assistants and Nurse Practitioners) who all work together to provide you with the care you need, when you need it.  We recommend signing up for the patient portal called "MyChart".  Sign up information is provided on this After Visit Summary.  MyChart is used to connect with patients for Virtual Visits (Telemedicine).  Patients are able to view lab/test results, encounter notes, upcoming appointments, etc.  Non-urgent messages can be sent to your provider as well.   To learn more about what you can do with MyChart, go to [email or web address].    Your next  appointment:   AS NEEDED with  Dr. Wyline Mood     Important Information About Sugar         Signed, Maisie Fus, MD  09/25/2021 4:13 PM    [facility]

## 2021-09-25 NOTE — Patient Instructions (Signed)
Medication Instructions:  Your physician recommends that you continue on your current medications as directed. Please refer to the Current Medication list given to you today.  Testing/Procedures: Bryn Gulling- Long Term Monitor Instructions   Your physician has requested you wear a ZIO patch monitor for _3_ days.  This is a single patch monitor.   [employer] supplies one patch monitor per enrollment. Additional stickers are not available. Please do not apply patch if you will be having a Nuclear Stress Test, Echocardiogram, Cardiac CT, MRI, or Chest Xray during the period you would be wearing the monitor. The patch cannot be worn during these tests. You cannot remove and re-apply the ZIO XT patch monitor.  Your ZIO patch monitor will be sent Fed Ex from [employer] directly to your home address. It may take 3-5 days to receive your monitor after you have been enrolled.  Once you have received your monitor, please review the enclosed instructions. Your monitor has already been registered assigning a specific monitor serial # to you.  Billing and Patient Assistance Program Information   We have supplied [employer] with any of your insurance information on file for billing purposes. [employer] offers a sliding scale Patient Assistance Program for patients that do not have insurance, or whose insurance does not completely cover the cost of the ZIO monitor.   You must apply for the Patient Assistance Program to qualify for this discounted rate.     To apply, please call [employer] at [phone], select option 4, then select option 2, and ask to apply for Patient Assistance Program.  Theodore Demark will ask your household income, and how many people are in your household.  They will quote your out-of-pocket cost based on that information.  [employer] will also be able to set up a 44-month, interest-free payment plan if needed.  Applying the monitor   Shave hair from upper left chest.  Hold abrader disc by orange tab.  Rub abrader in 40 strokes over the upper left chest as indicated in your monitor instructions.  Clean area with 4 enclosed alcohol pads. Let dry.  Apply patch as indicated in monitor instructions. Patch will be placed under collarbone on left side of chest with arrow pointing upward.  Rub patch adhesive wings for 2 minutes. Remove white label marked "1". Remove the white label marked "2". Rub patch adhesive wings for 2 additional minutes.  While looking in a mirror, press and release button in center of patch. A small green light will flash 3-4 times. This will be your only indicator that the monitor has been turned on. ?  Do not shower for the first 24 hours. You may shower after the first 24 hours.  Press the button if you feel a symptom. You will hear a small click. Record Date, Time and Symptom in the Patient Logbook.  When you are ready to remove the patch, follow instructions on the last 2 pages of the Patient Logbook. Stick patch monitor onto the last page of Patient Logbook.  Place Patient Logbook in the blue and white box.  Use locking tab on box and tape box closed securely.  The blue and white box has prepaid postage on it. Please place it in the mailbox as soon as possible. Your physician should have your test results approximately 7 days after the monitor has been mailed back to [facility].  Call [facility] at [phone] if you have questions regarding your ZIO XT patch monitor. Call them immediately if you see  an orange light blinking on your monitor.  If your monitor falls off in less than 4 days, contact our Monitor department at [phone]. ?If your monitor becomes loose or falls off after 4 days call [employer] at [phone] for suggestions on securing your monitor.?  Follow-Up: At [facility], you and your health needs are our priority.  As part of our continuing mission to provide you with exceptional heart care, we have created designated Provider Care  Teams.  These Care Teams include your primary Cardiologist (physician) and Advanced Practice Providers (APPs -  Physician Assistants and Nurse Practitioners) who all work together to provide you with the care you need, when you need it.  We recommend signing up for the patient portal called "MyChart".  Sign up information is provided on this After Visit Summary.  MyChart is used to connect with patients for Virtual Visits (Telemedicine).  Patients are able to view lab/test results, encounter notes, upcoming appointments, etc.  Non-urgent messages can be sent to your provider as well.   To learn more about what you can do with MyChart, go to [email or web address].    Your next appointment:   AS NEEDED with Dr. Wyline Mood     Important Information About Sugar

## 2021-09-26 ENCOUNTER — Ambulatory Visit (INDEPENDENT_AMBULATORY_CARE_PROVIDER_SITE_OTHER): Payer: BC Managed Care – PPO

## 2021-09-26 DIAGNOSIS — R002 Palpitations: Secondary | ICD-10-CM

## 2021-09-26 NOTE — Progress Notes (Unsigned)
Enrolled for [employer] to mail a ZIO XT long term holter monitor to the patients address on file.  

## 2021-09-27 ENCOUNTER — Encounter: Payer: Self-pay | Admitting: Physical Therapy

## 2021-09-27 ENCOUNTER — Ambulatory Visit (INDEPENDENT_AMBULATORY_CARE_PROVIDER_SITE_OTHER): Payer: BC Managed Care – PPO | Admitting: Physical Therapy

## 2021-09-27 DIAGNOSIS — M542 Cervicalgia: Secondary | ICD-10-CM | POA: Diagnosis not present

## 2021-09-27 DIAGNOSIS — M6281 Muscle weakness (generalized): Secondary | ICD-10-CM

## 2021-09-27 NOTE — Therapy (Signed)
OUTPATIENT PHYSICAL THERAPY CERVICAL EVALUATION   Patient Name: Natasha Murphy MRN: [id number] DOB:1970-04-20, 51 y.o., female Today's Date: 09/27/2021   PT End of Session - 09/27/21 1348     Visit Number 1    Number of Visits 12    Date for PT Re-Evaluation 11/08/21    Authorization Type BCBS    PT Start Time 1349    PT Stop Time 1422    PT Time Calculation (min) 33 min             Past Medical History:  Diagnosis Date   Depression 2015   GERD (gastroesophageal reflux disease) 2020   Heart murmur 1995   HSV-2 seropositive    Hx of chlamydia infection 1995, 01/2009   Hx of gonorrhea 1995   Lactose intolerance    Varicose veins    Vitamin D deficiency    Past Surgical History:  Procedure Laterality Date   CESAREAN SECTION WITH BILATERAL TUBAL LIGATION     UMBILICAL HERNIA REPAIR  age 79   Patient Active Problem List   Diagnosis Date Noted   Anterior tibialis tendinitis of left leg 01/16/2017   Osteoarthritis of spine with radiculopathy, cervical region 01/16/2017   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   Osteoarthritis of spine 10/30/2016   Vitamin D deficiency 10/02/2016   Pain in both knees 09/12/2016    PCP: Jeani Sow, MD  REFERRING PROVIDER: Jeani Sow, MD  REFERRING DIAG: [phone] (ICD-10-CM) - Cervical radiculopathy  THERAPY DIAG:  Cervicalgia  Muscle weakness (generalized)  Rationale for Evaluation and Treatment Rehabilitation  ONSET DATE: a couple years  SUBJECTIVE:                                                                                                                                                                                                         SUBJECTIVE STATEMENT: States that her left side is all painful. States that her lateral two 2 toes go numb occassionally. States that her neck feels swollen on the left side, tight and irritation. States that she sometimes gets a bulge under the shoulder blade.  States she sits a lot for work.  States that she mostly sleeps on one of her sides and is uncomfortable with sleep. Reports occasional numbness in her left pinky finger  Hx of neck pain and DN   PERTINENT HISTORY:  Previous neck pain  PAIN:  Are you having pain? Yes: NPRS scale: 4/10 Pain location: left side of neck UT Pain description: throbbing  Aggravating factors: stress, sitting Relieving factors:  support   PRECAUTIONS: None  WEIGHT BEARING RESTRICTIONS No  FALLS:  Has patient fallen in last 6 months? No    OCCUPATION: customer service at [employer]  PLOF: Independent  PATIENT GOALS have a reduction in symptoms  OBJECTIVE:    COGNITION: Overall cognitive status: Within functional limits for tasks assessed   POSTURE: rounded shoulders and forward head  PALPATION: Tenderness to palpation aling bilateral UT/shoulder musculature with L>R, suboccipitals and UT with TP and increased resting tone on right compared to left  CERVICAL ROM:   Active ROM A/PROM (deg) eval  Flexion 55 pulling on left  Extension 25  Right lateral flexion 30* pulling on left  Left lateral flexion 24 pulling on right  Right rotation 55  Left rotation 50* tightness    (Blank rows = not tested)    UE Measurements Upper Extremity Right 09/27/2021 Left 09/27/2021   A/PROM MMT A/PROM MMT  Shoulder Flexion WFL 4+ WFL* 4+  Shoulder Extension      Shoulder Abduction WFL 4+ WFL 4+  Shoulder Adduction      Shoulder Internal Rotation WFL* 4+ WFL 4+  Shoulder External Rotation WFL 4 WFL 4  Elbow Flexion      Elbow Extension      Wrist Flexion      Wrist Extension      Wrist Supination      Wrist Pronation      Wrist Ulnar Deviation      Wrist Radial Deviation      Grip Strength NA  NA     (Blank rows = not tested)   * pain   CERVICAL SPECIAL TESTS:   Spurlings negative, ulnar/median/radial nerve glides negative bilaterally, neg distraction test    TODAY'S TREATMENT:   09/27/2021  Therapeutic Exercise:  Aerobic: Supine: Prone:  Seated:  Standing: Neuromuscular Re-education: Manual Therapy: Therapeutic Activity: Self Care: Trigger Point Dry Needling:  Modalities:     PATIENT EDUCATION:  Education details: on current presentation, on HEP, on clinical outcomes score and POC Person educated: Patient Education method: Explanation, Demonstration, and Handouts Education comprehension: verbalized understanding   HOME EXERCISE PROGRAM: [id number]  ASSESSMENT:  CLINICAL IMPRESSION: Patient is a 51 y.o. female who was seen today for physical therapy evaluation and treatment for neck and shoulder pain. Pain is primarily on the left side but can refer to the right side too. Pain is worse after sitting at work and when stressed. Patient demonstrates pain, postural deficits and ROM deficits and would greatly benefit from skilled PT to improve overall function and QOL..    OBJECTIVE IMPAIRMENTS decreased activity tolerance, decreased ROM, decreased strength, postural dysfunction, and pain.   ACTIVITY LIMITATIONS lifting and reach over head  PARTICIPATION LIMITATIONS: occupation  PERSONAL FACTORS Age and Fitness are also affecting patient's functional outcome.   REHAB POTENTIAL: Good  CLINICAL DECISION MAKING: Stable/uncomplicated  EVALUATION COMPLEXITY: Low   GOALS: Goals reviewed with patient?  yes  SHORT TERM GOALS:  Patient will be independent in self management strategies to improve quality of life and functional outcomes. Baseline: new program Target date: 10/18/2021 Goal status: INITIAL  2.  Patient will report at least 50% improvement in overall symptoms and/or function to demonstrate improved functional mobility Baseline: 0% Target date: 10/18/2021 Goal status: INITIAL  3.  Patient will report no radicular symptoms in 1 week period to demonstrate improved UE function. Baseline: radicular symptoms Target date: 10/18/2021 Goal  status: INITIAL      LONG TERM GOALS:  Patient will  report at least 75% improvement in overall symptoms and/or function to demonstrate improved functional mobility Baseline: 0% Target date: 11/08/2021 Goal status: INITIAL  2.  Patient will be be able to demonstrate pain-free cervical ROM.  Baseline: painful Target date: 11/08/2021 Goal status: INITIAL  3.  Patient will be able to report performing daily stretches and posture exercises to improve posture. Baseline: none currently Target date: 11/08/2021 Goal status: INITIAL      PLAN: PT FREQUENCY: 2x/week  PT DURATION: 6 weeks  PLANNED INTERVENTIONS: Therapeutic exercises, Therapeutic activity, Neuromuscular re-education, Balance training, Gait training, Patient/Family education, Joint mobilization, Aquatic Therapy, Dry Needling, Electrical stimulation, Spinal manipulation, Cryotherapy, Moist heat, Ionotophoresis 4mg /ml Dexamethasone, and Manual therapy  PLAN FOR NEXT SESSION: DN, percussion gun, STM, stretches, posture exercises.   3:43 PM, 09/27/21 09/29/21, DPT Physical Therapy with [facility]

## 2021-09-30 DIAGNOSIS — R002 Palpitations: Secondary | ICD-10-CM | POA: Diagnosis not present

## 2021-10-03 ENCOUNTER — Ambulatory Visit (INDEPENDENT_AMBULATORY_CARE_PROVIDER_SITE_OTHER): Payer: BC Managed Care – PPO | Admitting: Physical Therapy

## 2021-10-03 ENCOUNTER — Encounter: Payer: Self-pay | Admitting: Physical Therapy

## 2021-10-03 DIAGNOSIS — M542 Cervicalgia: Secondary | ICD-10-CM

## 2021-10-03 DIAGNOSIS — M6281 Muscle weakness (generalized): Secondary | ICD-10-CM | POA: Diagnosis not present

## 2021-10-17 ENCOUNTER — Encounter: Payer: Self-pay | Admitting: Physical Therapy

## 2021-10-17 ENCOUNTER — Ambulatory Visit (INDEPENDENT_AMBULATORY_CARE_PROVIDER_SITE_OTHER): Payer: BC Managed Care – PPO | Admitting: Physical Therapy

## 2021-10-17 DIAGNOSIS — M6281 Muscle weakness (generalized): Secondary | ICD-10-CM | POA: Diagnosis not present

## 2021-10-17 DIAGNOSIS — M542 Cervicalgia: Secondary | ICD-10-CM | POA: Diagnosis not present

## 2021-10-17 NOTE — Therapy (Signed)
OUTPATIENT PHYSICAL THERAPY TREATMENT NOTE   Patient Name: Natasha Murphy MRN: [id number] DOB:1971/01/28, 51 y.o., female Today's Date: 10/17/2021  END OF SESSION:   PT End of Session - 10/17/21 1600     Visit Number 3    Number of Visits 12    Date for PT Re-Evaluation 11/08/21    Authorization Type [employer]    PT Start Time 1600    PT Stop Time 1640    PT Time Calculation (min) 40 min             Past Medical History:  Diagnosis Date   Depression 2015   GERD (gastroesophageal reflux disease) 2020   Heart murmur 1995   HSV-2 seropositive    Hx of chlamydia infection 1995, 01/2009   Hx of gonorrhea 1995   Lactose intolerance    Varicose veins    Vitamin D deficiency    Past Surgical History:  Procedure Laterality Date   CESAREAN SECTION WITH BILATERAL TUBAL LIGATION     UMBILICAL HERNIA REPAIR  age 68   Patient Active Problem List   Diagnosis Date Noted   Anterior tibialis tendinitis of left leg 01/16/2017   Osteoarthritis of spine with radiculopathy, cervical region 01/16/2017   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   Osteoarthritis of spine 10/30/2016   Vitamin D deficiency 10/02/2016   Pain in both knees 09/12/2016     PCP: Jeani Sow, MD   REFERRING PROVIDER: Jeani Sow, MD   REFERRING DIAG: [phone] (ICD-10-CM) - Cervical radiculopathy   THERAPY DIAG:  Cervicalgia   Muscle weakness (generalized)   Rationale for Evaluation and Treatment Rehabilitation   ONSET DATE: a couple years   SUBJECTIVE:                                                                                                                                                                                                          SUBJECTIVE STATEMENT: 10/17/2021 States she still feels the knot but  the exercises help. States she Is back at work.   Eval: States that her left side is all painful. States that her lateral two 2 toes go numb occassionally. States that  her neck feels swollen on the left side, tight and irritation. States that she sometimes gets a bulge under the shoulder blade. States she sits a lot for work.  States that she mostly sleeps on one of her sides and is uncomfortable with sleep. Reports occasional numbness in her left pinky finger   Hx of neck pain and DN  PERTINENT HISTORY:  Previous neck pain   PAIN:  Are you having pain? no: NPRS scale: 5/10 Pain location: left side of neck UT Pain description: achy dull Aggravating factors: stress, sitting Relieving factors: support    PRECAUTIONS: None   WEIGHT BEARING RESTRICTIONS No   FALLS:  Has patient fallen in last 6 months? No     OCCUPATION: customer service at [employer]   PLOF: Independent   PATIENT GOALS have a reduction in symptoms   OBJECTIVE:      COGNITION: Overall cognitive status: Within functional limits for tasks assessed     POSTURE: rounded shoulders and forward head   PALPATION: Tenderness to palpation aling bilateral UT/shoulder musculature with L>R, suboccipitals and UT with TP and increased resting tone on right compared to left   CERVICAL ROM:    Active ROM A/PROM (deg) eval  Flexion 55 pulling on left  Extension 25  Right lateral flexion 30* pulling on left  Left lateral flexion 24 pulling on right  Right rotation 55  Left rotation 50* tightness    (Blank rows = not tested)                UE Measurements       Upper Extremity Right 09/27/2021 Left 09/27/2021    A/PROM MMT A/PROM MMT  Shoulder Flexion WFL 4+ WFL* 4+  Shoulder Extension          Shoulder Abduction WFL 4+ WFL 4+  Shoulder Adduction          Shoulder Internal Rotation WFL* 4+ WFL 4+  Shoulder External Rotation WFL 4 WFL 4  Elbow Flexion          Elbow Extension          Wrist Flexion          Wrist Extension          Wrist Supination          Wrist Pronation          Wrist Ulnar Deviation          Wrist Radial Deviation          Grip Strength NA   NA                           (Blank rows = not tested)                       * pain     CERVICAL SPECIAL TESTS:    Spurlings negative, ulnar/median/radial nerve glides negative bilaterally, neg distraction test       TODAY'S TREATMENT:  10/17/2021 Therapeutic Exercise:            Aerobic: Supine: Prone: shoulder extension x30 B            Seated: protraction 3 minutes, shoulder circles/arm circles - 3 minutes, neck rotation 3 minutes            Standing: shoulder hug with inhale hold - 3 minutes shoulder flexion up wall x20 B with pillow case assist Neuromuscular Re-education: Manual Therapy: STM to Left cervical paraspinals and UT Therapeutic Activity: Self Care: Trigger Point Dry Needling:   Trigger Point Dry-Needling  Treatment instructions: Expect mild to moderate muscle soreness. S/S of pneumothorax if dry needled over a lung field, and to seek immediate medical attention should they occur. Patient verbalized understanding of these instructions and education.  Patient Consent Given: Yes Education  handout provided: Yes Muscles treated: left UT Electrical stimulation performed: No Parameters: N/A Treatment response/outcome: reduced muscle tension  Modalities:        PATIENT EDUCATION:  Education details: on HEP, on risks/benefits of DN Person educated: Patient Education method: Explanation, Demonstration, and Handouts Education comprehension: verbalized understanding     HOME EXERCISE PROGRAM: [email or web address]   ASSESSMENT:   CLINICAL IMPRESSION: 10/17/2021 Session tolerated well. Added dry needling which was tolerated well with reduced tension in muscle noted end of session. Progressed shoulder exercises and added to HEP. Will continue with current POC as toelrated.   Eval: Patient is a 51 y.o. female who was seen today for physical therapy evaluation and treatment for neck and shoulder pain. Pain is primarily on the left side but can refer to the right side too. Pain is worse  after sitting at work and when stressed. Patient demonstrates pain, postural deficits and ROM deficits and would greatly benefit from skilled PT to improve overall function and QOL..      OBJECTIVE IMPAIRMENTS decreased activity tolerance, decreased ROM, decreased strength, postural dysfunction, and pain.    ACTIVITY LIMITATIONS lifting and reach over head   PARTICIPATION LIMITATIONS: occupation   PERSONAL FACTORS Age and Fitness are also affecting patient's functional outcome.    REHAB POTENTIAL: Good   CLINICAL DECISION MAKING: Stable/uncomplicated   EVALUATION COMPLEXITY: Low     GOALS: Goals reviewed with patient?  yes   SHORT TERM GOALS:   Patient will be independent in self management strategies to improve quality of life and functional outcomes. Baseline: new program Target date: 10/18/2021 Goal status: INITIAL   2.  Patient will report at least 50% improvement in overall symptoms and/or function to demonstrate improved functional mobility Baseline: 0% Target date: 10/18/2021 Goal status: INITIAL   3.  Patient will report no radicular symptoms in 1 week period to demonstrate improved UE function. Baseline: radicular symptoms Target date: 10/18/2021 Goal status: INITIAL           LONG TERM GOALS:   Patient will report at least 75% improvement in overall symptoms and/or function to demonstrate improved functional mobility Baseline: 0% Target date: 11/08/2021 Goal status: INITIAL   2.  Patient will be be able to demonstrate pain-free cervical ROM.  Baseline: painful Target date: 11/08/2021 Goal status: INITIAL   3.  Patient will be able to report performing daily stretches and posture exercises to improve posture. Baseline: none currently Target date: 11/08/2021 Goal status: INITIAL           PLAN: PT FREQUENCY: 2x/week   PT DURATION: 6 weeks   PLANNED INTERVENTIONS: Therapeutic exercises, Therapeutic activity, Neuromuscular re-education, Balance  training, Gait training, Patient/Family education, Joint mobilization, Aquatic Therapy, Dry Needling, Electrical stimulation, Spinal manipulation, Cryotherapy, Moist heat, Ionotophoresis 4mg /ml Dexamethasone, and Manual therapy   PLAN FOR NEXT SESSION: DN, percussion gun, STM, stretches, posture exercises.   4:40 PM, 10/17/21 12/18/21, DPT Physical Therapy with [facility]

## 2021-10-19 ENCOUNTER — Encounter: Payer: Self-pay | Admitting: Physical Therapy

## 2021-10-19 ENCOUNTER — Ambulatory Visit (INDEPENDENT_AMBULATORY_CARE_PROVIDER_SITE_OTHER): Payer: BC Managed Care – PPO | Admitting: Physical Therapy

## 2021-10-19 DIAGNOSIS — M542 Cervicalgia: Secondary | ICD-10-CM | POA: Diagnosis not present

## 2021-10-19 DIAGNOSIS — M6281 Muscle weakness (generalized): Secondary | ICD-10-CM

## 2021-10-19 NOTE — Therapy (Signed)
OUTPATIENT PHYSICAL THERAPY TREATMENT NOTE   Patient Name: Natasha Murphy MRN: [id number] DOB:1970-08-09, 51 y.o., female Today's Date: 10/19/2021  END OF SESSION:   PT End of Session - 10/19/21 1254     Visit Number 4    Number of Visits 12    Date for PT Re-Evaluation 11/08/21    Authorization Type [employer]    PT Start Time 1300    PT Stop Time [id number]    PT Time Calculation (min) 38 min             Past Medical History:  Diagnosis Date   Depression 2015   GERD (gastroesophageal reflux disease) 2020   Heart murmur 1995   HSV-2 seropositive    Hx of chlamydia infection 1995, 01/2009   Hx of gonorrhea 1995   Lactose intolerance    Varicose veins    Vitamin D deficiency    Past Surgical History:  Procedure Laterality Date   CESAREAN SECTION WITH BILATERAL TUBAL LIGATION     UMBILICAL HERNIA REPAIR  age 75   Patient Active Problem List   Diagnosis Date Noted   Anterior tibialis tendinitis of left leg 01/16/2017   Osteoarthritis of spine with radiculopathy, cervical region 01/16/2017   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   Osteoarthritis of spine 10/30/2016   Vitamin D deficiency 10/02/2016   Pain in both knees 09/12/2016     PCP: Tawnya Crook, MD   REFERRING PROVIDER: Tawnya Crook, MD   REFERRING DIAG: [id number] (ICD-10-CM) - Cervical radiculopathy   THERAPY DIAG:  Cervicalgia   Muscle weakness (generalized)   Rationale for Evaluation and Treatment Rehabilitation   ONSET DATE: a couple years   SUBJECTIVE:                                                                                                                                                                                                          SUBJECTIVE STATEMENT: 10/19/2021 States that she is still a little sore and had a headache afterwards.  Eval: States that her left side is all painful. States that her lateral two 2 toes go numb occassionally. States that her neck feels  swollen on the left side, tight and irritation. States that she sometimes gets a bulge under the shoulder blade. States she sits a lot for work.  States that she mostly sleeps on one of her sides and is uncomfortable with sleep. Reports occasional numbness in her left pinky finger   Hx of neck pain and DN    PERTINENT HISTORY:  Previous neck pain   PAIN:  Are you having pain? no: NPRS scale: 5/10 Pain location: left side of neck UT Pain description: achy dull Aggravating factors: stress, sitting Relieving factors: support    PRECAUTIONS: None   WEIGHT BEARING RESTRICTIONS No   FALLS:  Has patient fallen in last 6 months? No     OCCUPATION: customer service at [employer]   PLOF: Independent   PATIENT GOALS have a reduction in symptoms   OBJECTIVE:      COGNITION: Overall cognitive status: Within functional limits for tasks assessed     POSTURE: rounded shoulders and forward head   PALPATION: Tenderness to palpation aling bilateral UT/shoulder musculature with L>R, suboccipitals and UT with TP and increased resting tone on right compared to left   CERVICAL ROM:    Active ROM A/PROM (deg) eval  Flexion 55 pulling on left  Extension 25  Right lateral flexion 30* pulling on left  Left lateral flexion 24 pulling on right  Right rotation 55  Left rotation 50* tightness    (Blank rows = not tested)                UE Measurements       Upper Extremity Right 09/27/2021 Left 09/27/2021    A/PROM MMT A/PROM MMT  Shoulder Flexion WFL 4+ WFL* 4+  Shoulder Extension          Shoulder Abduction WFL 4+ WFL 4+  Shoulder Adduction          Shoulder Internal Rotation WFL* 4+ WFL 4+  Shoulder External Rotation WFL 4 WFL 4  Elbow Flexion          Elbow Extension          Wrist Flexion          Wrist Extension          Wrist Supination          Wrist Pronation          Wrist Ulnar Deviation          Wrist Radial Deviation          Grip Strength NA   NA                           (Blank rows = not tested)                       * pain     CERVICAL SPECIAL TESTS:    Spurlings negative, ulnar/median/radial nerve glides negative bilaterally, neg distraction test       TODAY'S TREATMENT:  10/19/2021 Therapeutic Exercise:            Quad: protraction/retraction 5 minutes, child's pose 3x5 10" holds Supine: Prone: shoulder extension x30 B            Seated: protraction 3 minutes, shoulder circles/arm circles - 3 minutes, neck rotation 3 minutes            Standing: shoulder flex with pull medially yellow band 4x5 B, shoulder rolls/arm circles - in between exercises, lateral shoulder taps with yellow band 5x5 B, shoulder ER at wall 3x10 B  Neuromuscular Re-education: Manual Therapy:  Therapeutic Activity: Self Care: Trigger Point Dry Needling:        PATIENT EDUCATION:  Education details: on HEP Person educated: Patient Education method: Explanation, Facilities manager, and Handouts Education comprehension: verbalized understanding     HOME EXERCISE PROGRAM: [email or web address]  ASSESSMENT:   CLINICAL IMPRESSION: 10/19/2021 Tolerated session well. Focused on strengthening and posture exercises. No pain noted but fatigue and reduced tension in shoulders/neck end of session. Will continue with current POC as tolerated.   Eval: Patient is a 51 y.o. female who was seen today for physical therapy evaluation and treatment for neck and shoulder pain. Pain is primarily on the left side but can refer to the right side too. Pain is worse after sitting at work and when stressed. Patient demonstrates pain, postural deficits and ROM deficits and would greatly benefit from skilled PT to improve overall function and QOL..      OBJECTIVE IMPAIRMENTS decreased activity tolerance, decreased ROM, decreased strength, postural dysfunction, and pain.    ACTIVITY LIMITATIONS lifting and reach over head   PARTICIPATION LIMITATIONS: occupation   PERSONAL FACTORS Age and Fitness are also  affecting patient's functional outcome.    REHAB POTENTIAL: Good   CLINICAL DECISION MAKING: Stable/uncomplicated   EVALUATION COMPLEXITY: Low     GOALS: Goals reviewed with patient?  yes   SHORT TERM GOALS:   Patient will be independent in self management strategies to improve quality of life and functional outcomes. Baseline: new program Target date: 10/18/2021 Goal status: INITIAL   2.  Patient will report at least 50% improvement in overall symptoms and/or function to demonstrate improved functional mobility Baseline: 0% Target date: 10/18/2021 Goal status: INITIAL   3.  Patient will report no radicular symptoms in 1 week period to demonstrate improved UE function. Baseline: radicular symptoms Target date: 10/18/2021 Goal status: INITIAL           LONG TERM GOALS:   Patient will report at least 75% improvement in overall symptoms and/or function to demonstrate improved functional mobility Baseline: 0% Target date: 11/08/2021 Goal status: INITIAL   2.  Patient will be be able to demonstrate pain-free cervical ROM.  Baseline: painful Target date: 11/08/2021 Goal status: INITIAL   3.  Patient will be able to report performing daily stretches and posture exercises to improve posture. Baseline: none currently Target date: 11/08/2021 Goal status: INITIAL           PLAN: PT FREQUENCY: 2x/week   PT DURATION: 6 weeks   PLANNED INTERVENTIONS: Therapeutic exercises, Therapeutic activity, Neuromuscular re-education, Balance training, Gait training, Patient/Family education, Joint mobilization, Aquatic Therapy, Dry Needling, Electrical stimulation, Spinal manipulation, Cryotherapy, Moist heat, Ionotophoresis 4mg /ml Dexamethasone, and Manual therapy   PLAN FOR NEXT SESSION: DN, percussion gun, STM, stretches, posture exercises.   1:39 PM, 10/19/21 10/21/21, DPT Physical Therapy with [facility] [place]

## 2021-10-24 ENCOUNTER — Encounter: Payer: BC Managed Care – PPO | Admitting: Physical Therapy

## 2021-10-26 ENCOUNTER — Ambulatory Visit (INDEPENDENT_AMBULATORY_CARE_PROVIDER_SITE_OTHER): Payer: BC Managed Care – PPO | Admitting: Physical Therapy

## 2021-10-26 ENCOUNTER — Encounter: Payer: Self-pay | Admitting: Physical Therapy

## 2021-10-26 DIAGNOSIS — M6281 Muscle weakness (generalized): Secondary | ICD-10-CM | POA: Diagnosis not present

## 2021-10-26 DIAGNOSIS — M542 Cervicalgia: Secondary | ICD-10-CM

## 2021-10-26 NOTE — Therapy (Signed)
OUTPATIENT PHYSICAL THERAPY TREATMENT NOTE   Patient Name: Natasha Murphy MRN: [id number] DOB:07-19-1970, 51 y.o., female Today's Date: 10/26/2021  END OF SESSION:   PT End of Session - 10/26/21 1357     Visit Number 5    Number of Visits 12    Date for PT Re-Evaluation 11/08/21    Authorization Type BCBS    PT Start Time 1309    PT Stop Time 1358    PT Time Calculation (min) 49 min    Activity Tolerance Patient tolerated treatment well    Behavior During Therapy [facility] for tasks assessed/performed              Past Medical History:  Diagnosis Date   Depression 2015   GERD (gastroesophageal reflux disease) 2020   Heart murmur 1995   HSV-2 seropositive    Hx of chlamydia infection 1995, 01/2009   Hx of gonorrhea 1995   Lactose intolerance    Varicose veins    Vitamin D deficiency    Past Surgical History:  Procedure Laterality Date   CESAREAN SECTION WITH BILATERAL TUBAL LIGATION     UMBILICAL HERNIA REPAIR  age 66   Patient Active Problem List   Diagnosis Date Noted   Anterior tibialis tendinitis of left leg 01/16/2017   Osteoarthritis of spine with radiculopathy, cervical region 01/16/2017   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   Osteoarthritis of spine 10/30/2016   Vitamin D deficiency 10/02/2016   Pain in both knees 09/12/2016     PCP: Jeani Sow, MD   REFERRING PROVIDER: Jeani Sow, MD   REFERRING DIAG: [phone] (ICD-10-CM) - Cervical radiculopathy   THERAPY DIAG:  Cervicalgia   Muscle weakness (generalized)   Rationale for Evaluation and Treatment Rehabilitation   ONSET DATE: a couple years   SUBJECTIVE:                                                                                                                                                                                                          SUBJECTIVE STATEMENT: 10/26/2021 States that she is still having soreness in UT and shoulder blade region, but feels some  improvements. She has tried to do postural exercises while at work.   Eval: States that her left side is all painful. States that her lateral two 2 toes go numb occassionally. States that her neck feels swollen on the left side, tight and irritation. States that she sometimes gets a bulge under the shoulder blade. States she sits a lot for work.  States that  she mostly sleeps on one of her sides and is uncomfortable with sleep. Reports occasional numbness in her left pinky finger   Hx of neck pain and DN    PERTINENT HISTORY:  Previous neck pain   PAIN:  Are you having pain? no: NPRS scale: 5/10 Pain location: left side of neck UT Pain description: achy dull Aggravating factors: stress, sitting Relieving factors: support    PRECAUTIONS: None   WEIGHT BEARING RESTRICTIONS No   FALLS:  Has patient fallen in last 6 months? No     OCCUPATION: customer service at [employer]   PLOF: Independent   PATIENT GOALS have a reduction in symptoms   OBJECTIVE:      COGNITION: Overall cognitive status: Within functional limits for tasks assessed     POSTURE: rounded shoulders and forward head   PALPATION: Tenderness to palpation aling bilateral UT/shoulder musculature with L>R, suboccipitals and UT with TP and increased resting tone on right compared to left   CERVICAL ROM:    Active ROM A/PROM (deg) eval  Flexion 55 pulling on left  Extension 25  Right lateral flexion 30* pulling on left  Left lateral flexion 24 pulling on right  Right rotation 55  Left rotation 50* tightness    (Blank rows = not tested)                UE Measurements       Upper Extremity Right 09/27/2021 Left 09/27/2021    A/PROM MMT A/PROM MMT  Shoulder Flexion WFL 4+ WFL* 4+  Shoulder Extension          Shoulder Abduction WFL 4+ WFL 4+  Shoulder Adduction          Shoulder Internal Rotation WFL* 4+ WFL 4+  Shoulder External Rotation WFL 4 WFL 4  Elbow Flexion          Elbow Extension          Wrist  Flexion          Wrist Extension          Wrist Supination          Wrist Pronation          Wrist Ulnar Deviation          Wrist Radial Deviation          Grip Strength NA   NA                          (Blank rows = not tested)                       * pain     CERVICAL SPECIAL TESTS:    Spurlings negative, ulnar/median/radial nerve glides negative bilaterally, neg distraction test       TODAY'S TREATMENT:  10/26/2021 Therapeutic Exercise:            Quad: protraction/retraction x20, child's pose 3x5 10" holds Supine: Shoulder flexion x 10 with  Prone:             Seated:             Standing: shoulder rolls,  Scap retract arms at sides x 15;  shoulder ER at wall 3x10 B; shoulder stretch at counter, center, L, R x 2 min;  Neuromuscular Re-education: Manual Therapy:  STM/TPR to L UT and levator, Manual UT and levator stretches.  Therapeutic Activity: Self Care: Modalities:   Moist heat  pack to L cervical region x 10 min at end of session  Trigger Point Dry-Needling  Treatment instructions: Expect mild to moderate muscle soreness. S/S of pneumothorax if dry needled over a lung field, and to seek immediate medical attention should they occur. Patient verbalized understanding of these instructions and education.  Patient Consent Given: Yes Education handout provided: Previously provided Muscles treated: L UT and L Levator  Electrical stimulation performed: No Parameters: N/A Treatment response/outcome: UT: twitch response,  Levator: palpable increase in muscle length.      Previous:            Quad: protraction/retraction x20, child's pose 3x5 10" holds Supine: Shoulder flexion x 10 with  Prone: shoulder extension x30 B            Seated: protraction 3 minutes, shoulder circles/arm circles - 3 minutes, neck rotation 3 minutes            Standing: shoulder flex with pull medially yellow band 4x5 B, shoulder rolls/arm circles - in between exercises, lateral shoulder taps  with yellow band 5x5 B, shoulder ER at wall 3x10 B        PATIENT EDUCATION:  Education details:Reviewed  HEP Person educated: Patient Education method: Explanation, Demonstration, and Handouts Education comprehension: verbalized understanding     HOME EXERCISE PROGRAM: [email or web address]   ASSESSMENT:   CLINICAL IMPRESSION: 10/26/2021 Pt with good ability for ther ex for postural awareness and strength. Good tolerance for manual and dry needling- done for increased muscle tension and pain in R levator and UT. Pt to benefit from continued care.   Eval: Patient is a 51 y.o. female who was seen today for physical therapy evaluation and treatment for neck and shoulder pain. Pain is primarily on the left side but can refer to the right side too. Pain is worse after sitting at work and when stressed. Patient demonstrates pain, postural deficits and ROM deficits and would greatly benefit from skilled PT to improve overall function and QOL..      OBJECTIVE IMPAIRMENTS decreased activity tolerance, decreased ROM, decreased strength, postural dysfunction, and pain.    ACTIVITY LIMITATIONS lifting and reach over head   PARTICIPATION LIMITATIONS: occupation   PERSONAL FACTORS Age and Fitness are also affecting patient's functional outcome.    REHAB POTENTIAL: Good   CLINICAL DECISION MAKING: Stable/uncomplicated   EVALUATION COMPLEXITY: Low     GOALS: Goals reviewed with patient?  yes   SHORT TERM GOALS:   Patient will be independent in self management strategies to improve quality of life and functional outcomes. Baseline: new program Target date: 10/18/2021 Goal status: INITIAL   2.  Patient will report at least 50% improvement in overall symptoms and/or function to demonstrate improved functional mobility Baseline: 0% Target date: 10/18/2021 Goal status: INITIAL   3.  Patient will report no radicular symptoms in 1 week period to demonstrate improved UE function. Baseline: radicular  symptoms Target date: 10/18/2021 Goal status: INITIAL           LONG TERM GOALS:   Patient will report at least 75% improvement in overall symptoms and/or function to demonstrate improved functional mobility Baseline: 0% Target date: 11/08/2021 Goal status: INITIAL   2.  Patient will be be able to demonstrate pain-free cervical ROM.  Baseline: painful Target date: 11/08/2021 Goal status: INITIAL   3.  Patient will be able to report performing daily stretches and posture exercises to improve posture. Baseline: none currently Target date: 11/08/2021 Goal status: INITIAL  PLAN: PT FREQUENCY: 2x/week   PT DURATION: 6 weeks   PLANNED INTERVENTIONS: Therapeutic exercises, Therapeutic activity, Neuromuscular re-education, Balance training, Gait training, Patient/Family education, Joint mobilization, Aquatic Therapy, Dry Needling, Electrical stimulation, Spinal manipulation, Cryotherapy, Moist heat, Ionotophoresis 4mg /ml Dexamethasone, and Manual therapy   PLAN FOR NEXT SESSION: DN, percussion gun, STM, stretches, posture exercises.    , PT, DPT 2:05 PM  10/26/21

## 2021-10-31 ENCOUNTER — Encounter: Payer: BC Managed Care – PPO | Admitting: Physical Therapy

## 2021-11-02 ENCOUNTER — Ambulatory Visit (INDEPENDENT_AMBULATORY_CARE_PROVIDER_SITE_OTHER): Payer: BC Managed Care – PPO | Admitting: Physical Therapy

## 2021-11-02 ENCOUNTER — Encounter: Payer: Self-pay | Admitting: Physical Therapy

## 2021-11-02 DIAGNOSIS — M542 Cervicalgia: Secondary | ICD-10-CM | POA: Diagnosis not present

## 2021-11-02 DIAGNOSIS — M6281 Muscle weakness (generalized): Secondary | ICD-10-CM

## 2021-11-02 NOTE — Therapy (Signed)
OUTPATIENT PHYSICAL THERAPY TREATMENT NOTE   Patient Name: Natasha Murphy MRN: [id number] DOB:July 26, 1970, 51 y.o., female Today's Date: 11/02/2021  END OF SESSION:   PT End of Session - 11/02/21 1556     Visit Number 6    Number of Visits 12    Date for PT Re-Evaluation 11/08/21    Authorization Type [employer]    PT Start Time 1601    PT Stop Time 1640    PT Time Calculation (min) 39 min    Activity Tolerance Patient tolerated treatment well    Behavior During Therapy [facility] for tasks assessed/performed              Past Medical History:  Diagnosis Date   Depression 2015   GERD (gastroesophageal reflux disease) 2020   Heart murmur 1995   HSV-2 seropositive    Hx of chlamydia infection 1995, 01/2009   Hx of gonorrhea 1995   Lactose intolerance    Varicose veins    Vitamin D deficiency    Past Surgical History:  Procedure Laterality Date   CESAREAN SECTION WITH BILATERAL TUBAL LIGATION     UMBILICAL HERNIA REPAIR  age 64   Patient Active Problem List   Diagnosis Date Noted   Anterior tibialis tendinitis of left leg 01/16/2017   Osteoarthritis of spine with radiculopathy, cervical region 01/16/2017   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   Osteoarthritis of spine 10/30/2016   Vitamin D deficiency 10/02/2016   Pain in both knees 09/12/2016     PCP: Jeani Sow, MD   REFERRING PROVIDER: Jeani Sow, MD   REFERRING DIAG: [phone] (ICD-10-CM) - Cervical radiculopathy   THERAPY DIAG:  Cervicalgia   Muscle weakness (generalized)   Rationale for Evaluation and Treatment Rehabilitation   ONSET DATE: a couple years   SUBJECTIVE:                                                                                                                                                                                                          SUBJECTIVE STATEMENT: 11/02/2021  States that she feels better and has been doing her exercises. States that she didn't go  to work today so she is feeling ok. States she felt looser a couple days after her last session.   Eval: States that her left side is all painful. States that her lateral two 2 toes go numb occassionally. States that her neck feels swollen on the left side, tight and irritation. States that she sometimes gets a bulge under the shoulder blade. States  she sits a lot for work.  States that she mostly sleeps on one of her sides and is uncomfortable with sleep. Reports occasional numbness in her left pinky finger   Hx of neck pain and DN    PERTINENT HISTORY:  Previous neck pain   PAIN:  Are you having pain? no: NPRS scale: 3/10 Pain location: left side of neck UT Pain description: achy dull Aggravating factors: stress, sitting Relieving factors: support    PRECAUTIONS: None   WEIGHT BEARING RESTRICTIONS No   FALLS:  Has patient fallen in last 6 months? No     OCCUPATION: customer service at [employer]   PLOF: Independent   PATIENT GOALS have a reduction in symptoms   OBJECTIVE:      COGNITION: Overall cognitive status: Within functional limits for tasks assessed     POSTURE: rounded shoulders and forward head   PALPATION: Tenderness to palpation aling bilateral UT/shoulder musculature with L>R, suboccipitals and UT with TP and increased resting tone on right compared to left   CERVICAL ROM:    Active ROM A/PROM (deg) eval  Flexion 55 pulling on left  Extension 25  Right lateral flexion 30* pulling on left  Left lateral flexion 24 pulling on right  Right rotation 55  Left rotation 50* tightness    (Blank rows = not tested)                UE Measurements       Upper Extremity Right 09/27/2021 Left 09/27/2021    A/PROM MMT A/PROM MMT  Shoulder Flexion WFL 4+ WFL* 4+  Shoulder Extension          Shoulder Abduction WFL 4+ WFL 4+  Shoulder Adduction          Shoulder Internal Rotation WFL* 4+ WFL 4+  Shoulder External Rotation WFL 4 WFL 4  Elbow Flexion           Elbow Extension          Wrist Flexion          Wrist Extension          Wrist Supination          Wrist Pronation          Wrist Ulnar Deviation          Wrist Radial Deviation          Grip Strength NA   NA                          (Blank rows = not tested)                       * pain     CERVICAL SPECIAL TESTS:    Spurlings negative, ulnar/median/radial nerve glides negative bilaterally, neg distraction test       TODAY'S TREATMENT:  11/02/2021 Therapeutic Exercise:            Quad:   Supine: Shoulder  protraction x15 5" holds, chin tuck x15 5" holds Prone:             Seated:             Standing: shoulder rolls,  shoulder flexion with towel roll down back x30 B, lumbar side bending at wall - x2 minutes, shoulder external rotation with towel roll down back 2 minutes x2 Neuromuscular Re-education: Manual Therapy:  STM/TPR to B suboccipitals, UTs, cervical traction and STM  ad SCM R >L Therapeutic Activity: Self Care: Modalities:        Previous:            Quad: protraction/retraction x20, child's pose 3x5 10" holds Supine: Shoulder flexion x 10 with  Prone: shoulder extension x30 B            Seated: protraction 3 minutes, shoulder circles/arm circles - 3 minutes, neck rotation 3 minutes            Standing: shoulder flex with pull medially yellow band 4x5 B, shoulder rolls/arm circles - in between exercises, lateral shoulder taps with yellow band 5x5 B, shoulder ER at wall 3x10 B        PATIENT EDUCATION:  Education details:Reviewed  HEP Person educated: Patient Education method: Explanation, Demonstration, and Handouts Education comprehension: verbalized understanding     HOME EXERCISE PROGRAM: [id number]   ASSESSMENT:   CLINICAL IMPRESSION: 11/02/2021 No needling per patient request today. Increased resting tone noted in R SCM compared to left but poor to mild tolerance to soft tissue of this area. Educated patient on self mobilization of SCM and goals  for exercises. Added lumbar stretches which were tolerated well. Reduced tension noted end of session. Instructed patient to use vertical towel roll at work occasionally for posture cue.   Eval: Patient is a 51 y.o. female who was seen today for physical therapy evaluation and treatment for neck and shoulder pain. Pain is primarily on the left side but can refer to the right side too. Pain is worse after sitting at work and when stressed. Patient demonstrates pain, postural deficits and ROM deficits and would greatly benefit from skilled PT to improve overall function and QOL..      OBJECTIVE IMPAIRMENTS decreased activity tolerance, decreased ROM, decreased strength, postural dysfunction, and pain.    ACTIVITY LIMITATIONS lifting and reach over head   PARTICIPATION LIMITATIONS: occupation   PERSONAL FACTORS Age and Fitness are also affecting patient's functional outcome.    REHAB POTENTIAL: Good   CLINICAL DECISION MAKING: Stable/uncomplicated   EVALUATION COMPLEXITY: Low     GOALS: Goals reviewed with patient?  yes   SHORT TERM GOALS:   Patient will be independent in self management strategies to improve quality of life and functional outcomes. Baseline: new program Target date: 10/18/2021 Goal status: INITIAL   2.  Patient will report at least 50% improvement in overall symptoms and/or function to demonstrate improved functional mobility Baseline: 0% Target date: 10/18/2021 Goal status: INITIAL   3.  Patient will report no radicular symptoms in 1 week period to demonstrate improved UE function. Baseline: radicular symptoms Target date: 10/18/2021 Goal status: INITIAL           LONG TERM GOALS:   Patient will report at least 75% improvement in overall symptoms and/or function to demonstrate improved functional mobility Baseline: 0% Target date: 11/08/2021 Goal status: INITIAL   2.  Patient will be be able to demonstrate pain-free cervical ROM.  Baseline:  painful Target date: 11/08/2021 Goal status: INITIAL   3.  Patient will be able to report performing daily stretches and posture exercises to improve posture. Baseline: none currently Target date: 11/08/2021 Goal status: INITIAL           PLAN: PT FREQUENCY: 2x/week   PT DURATION: 6 weeks   PLANNED INTERVENTIONS: Therapeutic exercises, Therapeutic activity, Neuromuscular re-education, Balance training, Gait training, Patient/Family education, Joint mobilization, Aquatic Therapy, Dry Needling, Electrical stimulation, Spinal manipulation, Cryotherapy, Moist heat, Ionotophoresis 4mg /ml Dexamethasone,  and Manual therapy   PLAN FOR NEXT SESSION: DN, percussion gun, STM, stretches, posture exercises.   4:42 PM, 11/02/21 Tereasa Coop, DPT Physical Therapy with [facility]

## 2021-11-09 ENCOUNTER — Encounter: Payer: Self-pay | Admitting: Physical Therapy

## 2021-11-09 ENCOUNTER — Ambulatory Visit (INDEPENDENT_AMBULATORY_CARE_PROVIDER_SITE_OTHER): Payer: BC Managed Care – PPO | Admitting: Physical Therapy

## 2021-11-09 DIAGNOSIS — M6281 Muscle weakness (generalized): Secondary | ICD-10-CM

## 2021-11-09 DIAGNOSIS — M542 Cervicalgia: Secondary | ICD-10-CM | POA: Diagnosis not present

## 2021-11-09 NOTE — Therapy (Signed)
OUTPATIENT PHYSICAL THERAPY TREATMENT NOTE   Patient Name: Natasha Murphy MRN: [id number] DOB:Mar 03, 1971, 51 y.o., female Today's Date: 11/09/2021  END OF SESSION:   PT End of Session - 11/09/21 1447     Visit Number 7    Number of Visits 12    Date for PT Re-Evaluation 11/08/21    Authorization Type BCBS    PT Start Time 1435    PT Stop Time 7829    PT Time Calculation (min) 38 min    Activity Tolerance Patient tolerated treatment well    Behavior During Therapy [facility] for tasks assessed/performed               Past Medical History:  Diagnosis Date   Depression 2015   GERD (gastroesophageal reflux disease) 2020   Heart murmur 1995   HSV-2 seropositive    Hx of chlamydia infection 1995, 01/2009   Hx of gonorrhea 1995   Lactose intolerance    Varicose veins    Vitamin D deficiency    Past Surgical History:  Procedure Laterality Date   CESAREAN SECTION WITH BILATERAL TUBAL LIGATION     UMBILICAL HERNIA REPAIR  age 22   Patient Active Problem List   Diagnosis Date Noted   Anterior tibialis tendinitis of left leg 01/16/2017   Osteoarthritis of spine with radiculopathy, cervical region 01/16/2017   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   Osteoarthritis of spine 10/30/2016   Vitamin D deficiency 10/02/2016   Pain in both knees 09/12/2016     PCP: Tawnya Crook, MD   REFERRING PROVIDER: Tawnya Crook, MD   REFERRING DIAG: [phone] (ICD-10-CM) - Cervical radiculopathy   THERAPY DIAG:  Cervicalgia   Muscle weakness (generalized)   Rationale for Evaluation and Treatment Rehabilitation   ONSET DATE: a couple years   SUBJECTIVE:                                                                                                                                                                                                          SUBJECTIVE STATEMENT: 11/09/2021  States that she is feeling less pain, less often. Does have "tightness"feeling in L UT  region at times.   Eval: States that her left side is all painful. States that her lateral two 2 toes go numb occassionally. States that her neck feels swollen on the left side, tight and irritation. States that she sometimes gets a bulge under the shoulder blade. States she sits a lot for work.  States that she mostly sleeps on one of her  sides and is uncomfortable with sleep. Reports occasional numbness in her left pinky finger   Hx of neck pain and DN    PERTINENT HISTORY:  Previous neck pain   PAIN:  Are you having pain? no: NPRS scale: 2-3/ 10 Pain location: left side of neck UT Pain description: achy dull Aggravating factors: stress, sitting Relieving factors: support    PRECAUTIONS: None   WEIGHT BEARING RESTRICTIONS No   FALLS:  Has patient fallen in last 6 months? No     OCCUPATION: customer service at [employer]   PLOF: Independent   PATIENT GOALS have a reduction in symptoms   OBJECTIVE:      COGNITION: Overall cognitive status: Within functional limits for tasks assessed     POSTURE: rounded shoulders and forward head   PALPATION:    CERVICAL ROM:    11/09/21: WFL, very slight tightness sensation at end range of L rotation.    Active ROM A/PROM (deg) eval  Flexion 55 pulling on left  Extension 25  Right lateral flexion 30* pulling on left  Left lateral flexion 24 pulling on right  Right rotation 55  Left rotation 50* tightness    (Blank rows = not tested)                UE Measurements       Upper Extremity Right 09/27/2021 Left 09/27/2021    A/PROM MMT A/PROM MMT  Shoulder Flexion WFL 4+ WFL* 4+  Shoulder Extension          Shoulder Abduction WFL 4+ WFL 4+  Shoulder Adduction          Shoulder Internal Rotation WFL* 4+ WFL 4+  Shoulder External Rotation WFL 4 WFL 4  Elbow Flexion          Elbow Extension          Wrist Flexion          Wrist Extension          Wrist Supination          Wrist Pronation          Wrist Ulnar Deviation           Wrist Radial Deviation          Grip Strength NA   NA                          (Blank rows = not tested)                       * pain     CERVICAL SPECIAL TESTS:         TODAY'S TREATMENT:  11/09/2021 Therapeutic Exercise:            Quad:   Supine:  Prone:             Seated:             Standing: shoulder rolls,  shoulder flexion x10,  shoulder external rotation motion x 15; With RTB x 15; Scap Retraction 2x10;   Neuromuscular Re-education: Manual Therapy:  STM/TPR to L UT , manual stretching for Bil UT.  Therapeutic Activity: Self Care: Modalities:           PATIENT EDUCATION:  Education details:Reviewed  HEP Person educated: Patient Education method: Explanation, Demonstration, and Handouts Education comprehension: verbalized understanding     HOME EXERCISE PROGRAM: [email or web address]   ASSESSMENT:   CLINICAL IMPRESSION:  11/09/2021 Pt with mild tightness in L UT today. Overall she is doing much better, with much less pain. She is tolerating work posture much better and is taking more postural breaks. She is also doing well with HEP. Pt has met goals at this time and is ready for d/c to HEP, pt in agreement with plan. Final HEP reviewed today.       OBJECTIVE IMPAIRMENTS decreased activity tolerance, decreased ROM, decreased strength, postural dysfunction, and pain.    ACTIVITY LIMITATIONS lifting and reach over head   PARTICIPATION LIMITATIONS: occupation   PERSONAL FACTORS Age and Fitness are also affecting patient's functional outcome.    REHAB POTENTIAL: Good   CLINICAL DECISION MAKING: Stable/uncomplicated   EVALUATION COMPLEXITY: Low     GOALS: Goals reviewed with patient?  yes   SHORT TERM GOALS:   Patient will be independent in self management strategies to improve quality of life and functional outcomes. Baseline: new program Target date: 10/18/2021 Goal status:MET   2.  Patient will report at least 50% improvement in overall symptoms and/or  function to demonstrate improved functional mobility Baseline: 0% Target date: 10/18/2021 Goal status: MET   3.  Patient will report no radicular symptoms in 1 week period to demonstrate improved UE function. Baseline: radicular symptoms Target date: 10/18/2021 Goal status:MET           LONG TERM GOALS:   Patient will report at least 75% improvement in overall symptoms and/or function to demonstrate improved functional mobility Baseline: 0% Target date: 11/08/2021 Goal status: MET   2.  Patient will be be able to demonstrate pain-free cervical ROM.  Baseline: painful Target date: 11/08/2021 Goal status: MET   3.  Patient will be able to report performing daily stretches and posture exercises to improve posture. Baseline: none currently Target date: 11/08/2021 Goal status: MET           PLAN: PT FREQUENCY: 2x/week   PT DURATION: 6 weeks   PLANNED INTERVENTIONS: Therapeutic exercises, Therapeutic activity, Neuromuscular re-education, Balance training, Gait training, Patient/Family education, Joint mobilization, Aquatic Therapy, Dry Needling, Electrical stimulation, Spinal manipulation, Cryotherapy, Moist heat, Ionotophoresis 56m/ml Dexamethasone, and Manual therapy   PLAN FOR NEXT SESSION: DN, percussion gun, STM, stretches, posture exercises.   LLyndee Hensen PT, DPT 9:16 PM  11/09/21    PHYSICAL THERAPY DISCHARGE SUMMARY  Visits from Start of Care:7  Plan: Patient agrees to discharge.  Patient goals were  met. Patient is being discharged due to meeting the stated rehab goals.      LLyndee Hensen PT, DPT 9:16 PM  11/09/21

## 2021-11-16 ENCOUNTER — Encounter: Payer: BC Managed Care – PPO | Admitting: Physical Therapy

## 2021-12-28 ENCOUNTER — Ambulatory Visit (INDEPENDENT_AMBULATORY_CARE_PROVIDER_SITE_OTHER): Payer: BC Managed Care – PPO | Admitting: Family Medicine

## 2021-12-28 ENCOUNTER — Encounter: Payer: Self-pay | Admitting: Family Medicine

## 2021-12-28 VITALS — BP 112/80 | HR 72 | Temp 97.2°F | Resp 14 | Ht 67.0 in | Wt 223.8 lb

## 2021-12-28 DIAGNOSIS — H6122 Impacted cerumen, left ear: Secondary | ICD-10-CM | POA: Diagnosis not present

## 2021-12-28 NOTE — Progress Notes (Signed)
Subjective:     Patient ID: Natasha Murphy, female    DOB: 02-07-1971, 51 y.o.   MRN: [id number]  Chief Complaint  Patient presents with   Ears clogged    For the last year, more so left ear.    HPI Ears clogged-going on long time, but getting worse.  If lies on ear, "sticks" and feels more moisture.  L is worse. No trouble hearing.  No URI symptoms.   Health Maintenance Due  Topic Date Due   PAP SMEAR-Modifier  11/12/2020    Past Medical History:  Diagnosis Date   Depression 2015   GERD (gastroesophageal reflux disease) 2020   Heart murmur 1995   HSV-2 seropositive    Hx of chlamydia infection 1995, 01/2009   Hx of gonorrhea 1995   Lactose intolerance    Varicose veins    Vitamin D deficiency     Past Surgical History:  Procedure Laterality Date   CESAREAN SECTION WITH BILATERAL TUBAL LIGATION     UMBILICAL HERNIA REPAIR  age 48    Outpatient Medications Prior to Visit  Medication Sig Dispense Refill   acetaminophen (TYLENOL) 500 MG tablet Take 500-1,000 mg by mouth every 6 (six) hours as needed for moderate pain.     Ascorbic Acid (VITAMIN C) 500 MG CAPS See admin instructions.     cetirizine (ZYRTEC ALLERGY) 10 MG tablet Take 10 mg by mouth daily as needed for allergies.      Cholecalciferol (VITAMIN D) 2000 units CAPS Take 1 capsule by mouth daily.     ibuprofen (ADVIL) 600 MG tablet ibuprofen 600 mg tablet  TAKE 1 TABLET BY MOUTH EVERY 6 TO 8 HOURS AS NEEDED     pantoprazole (PROTONIX) 40 MG tablet TAKE 1 TABLET BY MOUTH EVERY DAY 30     sertraline (ZOLOFT) 50 MG tablet Take 50 mg by mouth daily.     traZODone (DESYREL) 50 MG tablet trazodone 50 mg tablet  TAKE 1 TABLET BY MOUTH AT BEDTIME     sertraline (ZOLOFT) 50 MG tablet Take 1 tablet by mouth daily. (Patient not taking: Reported on 12/28/2021)     No facility-administered medications prior to visit.    Allergies  Allergen Reactions   Tramadol Shortness Of Breath and Palpitations    Other  reaction(s): tachycardia   Penicillins Hives    Other reaction(s): rash   ROS neg/noncontributory except as noted HPI/below Pt still occ palp when anxious      Objective:     BP 112/80 (BP Location: Left Arm, Patient Position: Sitting)   Pulse 72   Temp (!) 97.2 F (36.2 C) (Temporal)   Resp 14   Ht 5\' 7"  (1.702 m)   Wt 223 lb 12.8 oz (101.5 kg)   SpO2 96%   BMI 35.05 kg/m  Wt Readings from Last 3 Encounters:  12/28/21 223 lb 12.8 oz (101.5 kg)  09/25/21 224 lb 9.6 oz (101.9 kg)  09/18/21 223 lb 2 oz (101.2 kg)    Physical Exam   Gen: WDWN NAD HEENT: NCAT, conjunctiva not injected, sclera nonicteric R ear-wax in canal at entrance-removed w/cotton swab.  L ear-occluded Wax.  EXT:  no edema MSK: no gross abnormalities.  NEURO: A&O x3.  CN II-XII intact.  PSYCH: normal mood. Good eye contact  Procedure.  Verbal consent given.  Irrig L ear w/H2O2 and water.  Wax removed but still a lot deep in and hard.  Starting to get uncomfortable w/irrigation.  Pt tolerated  well.  Keep clean and dry.   Discussed wax production     Assessment & Plan:   Problem List Items Addressed This Visit   None Visit Diagnoses     Impacted cerumen of left ear    -  Primary     Cerumen impaction L ear.  Irrigated.  Still a lot of hard ball of wax deep.  Debrox otc and f/u next wk for irrigation.  Keep clean/dry.   No orders of the defined types were placed in this encounter.   Wellington Hampshire, MD

## 2021-12-28 NOTE — Patient Instructions (Addendum)
It was very nice to see you today!  Dry ears with towel.   Debrox    PLEASE NOTE:  If you had any lab tests please let us know if you have not heard back within a few days. You may see your results on MyChart before we have a chance to review them but we will give you a call once they are reviewed by [place]. If we ordered any referrals today, please let us know if you have not heard from their office within the next week.   Please try these tips to maintain a healthy lifestyle:  Eat most of your calories during the day when you are active. Eliminate processed foods including packaged sweets (pies, cakes, cookies), reduce intake of potatoes, white bread, white pasta, and white rice. Look for whole grain options, oat flour or almond flour.  Each meal should contain half fruits/vegetables, one quarter protein, and one quarter carbs (no bigger than a computer mouse).  Cut down on sweet beverages. This includes juice, soda, and sweet tea. Also watch fruit intake, though this is a healthier sweet option, it still contains natural sugar! Limit to 3 servings daily.  Drink at least 1 glass of water with each meal and aim for at least 8 glasses per day  Exercise at least 150 minutes every week.

## 2022-01-05 ENCOUNTER — Ambulatory Visit: Payer: BC Managed Care – PPO | Admitting: Family Medicine

## 2022-06-06 ENCOUNTER — Ambulatory Visit (INDEPENDENT_AMBULATORY_CARE_PROVIDER_SITE_OTHER): Payer: BC Managed Care – PPO | Admitting: Family Medicine

## 2022-06-06 ENCOUNTER — Encounter: Payer: Self-pay | Admitting: Family Medicine

## 2022-06-06 VITALS — BP 100/80 | HR 82 | Temp 98.1°F | Ht 67.0 in | Wt 226.5 lb

## 2022-06-06 DIAGNOSIS — G8929 Other chronic pain: Secondary | ICD-10-CM | POA: Diagnosis not present

## 2022-06-06 DIAGNOSIS — M25562 Pain in left knee: Secondary | ICD-10-CM | POA: Diagnosis not present

## 2022-06-06 DIAGNOSIS — M25561 Pain in right knee: Secondary | ICD-10-CM | POA: Diagnosis not present

## 2022-06-06 NOTE — Progress Notes (Signed)
Subjective:     Patient ID: Natasha Murphy, female    DOB: 07-31-1970, 52 y.o.   MRN: [id number]  Chief Complaint  Patient presents with   Knee Issue    Left knee stiffness that started about 5 months ago     HPI Knee stiffness-past 5 mo L>R. Sits all day at work so does stairs to be active.  When gets up from sitting.  No pain to sit.  When gets up, feels "tight" more so than pain.  Feels "unsteady".  Stairs ok.  Limps when starts walking but works out .  Stiff knees when getting out of bed as well.   Not giving out. To pt, knee looks swollen.    >10 yrs, "popped" R knee out of place.  ER.   No other joints.    Health Maintenance Due  Topic Date Due   HIV Screening  Never done   Hepatitis C Screening  Never done   PAP SMEAR-Modifier  11/12/2020    Past Medical History:  Diagnosis Date   Depression 2015   GERD (gastroesophageal reflux disease) 2020   Heart murmur 1995   HSV-2 seropositive    Hx of chlamydia infection 1995, 01/2009   Hx of gonorrhea 1995   Lactose intolerance    Varicose veins    Vitamin D deficiency     Past Surgical History:  Procedure Laterality Date   CESAREAN SECTION WITH BILATERAL TUBAL LIGATION     UMBILICAL HERNIA REPAIR  age 48    Outpatient Medications Prior to Visit  Medication Sig Dispense Refill   acetaminophen (TYLENOL) 500 MG tablet Take 500-1,000 mg by mouth every 6 (six) hours as needed for moderate pain.     Ascorbic Acid (VITAMIN C) 500 MG CAPS See admin instructions.     cetirizine (ZYRTEC ALLERGY) 10 MG tablet Take 10 mg by mouth daily as needed for allergies.      Cholecalciferol (VITAMIN D) 2000 units CAPS Take 1 capsule by mouth daily.     famotidine (PEPCID) 10 MG tablet 1 tablet as needed Orally Twice a day     ibuprofen (ADVIL) 600 MG tablet ibuprofen 600 mg tablet  TAKE 1 TABLET BY MOUTH EVERY 6 TO 8 HOURS AS NEEDED     Multiple Vitamin (MULTIVITAMIN ADULT PO) Take by mouth.     pantoprazole (PROTONIX) 40 MG tablet TAKE  1 TABLET BY MOUTH EVERY DAY 30     sertraline (ZOLOFT) 50 MG tablet Take 1 tablet by mouth daily.     traZODone (DESYREL) 50 MG tablet trazodone 50 mg tablet  TAKE 1 TABLET BY MOUTH AT BEDTIME     sertraline (ZOLOFT) 50 MG tablet Take 50 mg by mouth daily.     No facility-administered medications prior to visit.    Allergies  Allergen Reactions   Tramadol Palpitations and Shortness Of Breath    Other reaction(s): tachycardia   Penicillins Hives and Rash    Other reaction(s): rash   ROS neg/noncontributory except as noted HPI/below      Objective:     BP 100/80   Pulse 82   Temp 98.1 F (36.7 C) (Temporal)   Ht '5\' 7"'$  (1.702 m)   Wt 226 lb 8 oz (102.7 kg)   SpO2 96%   BMI 35.47 kg/m  Wt Readings from Last 3 Encounters:  06/06/22 226 lb 8 oz (102.7 kg)  12/28/21 223 lb 12.8 oz (101.5 kg)  09/25/21 224 lb 9.6 oz (101.9 kg)  Physical Exam   Gen: WDWN NAD HEENT: NCAT, conjunctiva not injected, sclera nonicteric EXT:  no edema MSK: no gross abnormalities.. Knee exam: No deformity on inspection. B knees No pain with palpation of knee landmarks. No possibly small amt swelling superiorly B FROM in flex/extension.  Some R knee crepitus. No popliteal fullness. Neg drawer test. Neg mcmurray test. No pain with valgus/varus stress. No PFgrind. No abnormal patellar mobility.   NEURO: A&O x3.  CN II-XII intact.  PSYCH: normal mood. Good eye contact     Assessment & Plan:   Problem List Items Addressed This Visit       Other   Pain in both knees - Primary   Pain B knees-stretches.  See sports meds.   No orders of the defined types were placed in this encounter.   Wellington Hampshire, MD

## 2022-06-06 NOTE — Patient Instructions (Signed)
[facility] at [facility]  [address] on the 1st floor Phone number [phone]

## 2022-06-20 ENCOUNTER — Ambulatory Visit (INDEPENDENT_AMBULATORY_CARE_PROVIDER_SITE_OTHER): Payer: BC Managed Care – PPO | Admitting: Family Medicine

## 2022-06-20 ENCOUNTER — Encounter: Payer: Self-pay | Admitting: Family Medicine

## 2022-06-20 VITALS — BP 120/80 | HR 82 | Temp 98.3°F | Resp 18 | Ht 67.0 in | Wt 223.2 lb

## 2022-06-20 DIAGNOSIS — E559 Vitamin D deficiency, unspecified: Secondary | ICD-10-CM

## 2022-06-20 DIAGNOSIS — M4722 Other spondylosis with radiculopathy, cervical region: Secondary | ICD-10-CM

## 2022-06-20 DIAGNOSIS — Z1159 Encounter for screening for other viral diseases: Secondary | ICD-10-CM

## 2022-06-20 DIAGNOSIS — Z0000 Encounter for general adult medical examination without abnormal findings: Secondary | ICD-10-CM | POA: Diagnosis not present

## 2022-06-20 DIAGNOSIS — Z114 Encounter for screening for human immunodeficiency virus [HIV]: Secondary | ICD-10-CM

## 2022-06-20 MED ORDER — PANTOPRAZOLE SODIUM 40 MG PO TBEC: DELAYED_RELEASE_TABLET | ORAL | 0 refills | Status: DC

## 2022-06-20 MED ORDER — FAMOTIDINE 10 MG PO TABS: ORAL_TABLET | ORAL | 0 refills | Status: DC

## 2022-06-20 NOTE — Progress Notes (Signed)
Phone [phone]   Subjective:   Patient is a 52 y.o. female presenting for annual physical.    Chief Complaint  Patient presents with   Annual Exam    CPE Discuss pap Irritation between third and pinky toes   Annual-saw gyn 05/04/22.  Walks.  Working some on diet GERD-still getting symptoms but off for 1 yr.  Only occurs if eats after 7pm  See problem oriented charting- ROS- ROS: Gen: no fever, chills  Skin: no rash, itching ENT: no ear pain, ear drainage, nasal congestion, rhinorrhea, sinus pressure, sore throat Eyes: no blurry vision, double vision Resp: no cough, wheeze,SOB CV: no CP, palpitations, LE edema,  GI: no heartburn, n/v/d/c, abd pain GU: no dysuria, urgency, frequency, hematuria.  Menopause 1 yr ago MSK: knees Neuro: no dizziness, headache, weakness, vertigo Psych: no anxiety, insomnia, SI .  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   Osteoarthritis of spine 10/30/2016   Vitamin D deficiency 10/02/2016   Pain in both knees 09/12/2016   Past Surgical History:  Procedure Laterality Date   CESAREAN SECTION WITH BILATERAL TUBAL LIGATION     UMBILICAL HERNIA REPAIR  age 89    Family History  Problem Relation Age of Onset   Hypertension Mother    Depression Mother    Anxiety disorder Mother    Cancer Paternal Aunt    Cancer Paternal Grandmother    Breast cancer Neg Hx     Medications- reviewed and updated Current  Outpatient Medications  Medication Sig Dispense Refill   acetaminophen (TYLENOL) 500 MG tablet Take 500-1,000 mg by mouth every 6 (six) hours as needed for moderate pain.     Ascorbic Acid (VITAMIN C) 500 MG CAPS See admin instructions.     cetirizine (ZYRTEC ALLERGY) 10 MG tablet Take 10 mg by mouth daily as needed for allergies.      Cholecalciferol (VITAMIN D) 2000 units CAPS Take 1 capsule by mouth daily.     ibuprofen (ADVIL) 600 MG tablet ibuprofen 600 mg tablet  TAKE 1 TABLET BY MOUTH EVERY 6 TO 8 HOURS AS NEEDED     Multiple Vitamin (MULTIVITAMIN ADULT PO) Take by mouth.     sertraline (ZOLOFT) 50 MG tablet Take 1 tablet by mouth daily.     traZODone (DESYREL) 50 MG tablet trazodone 50 mg tablet  TAKE 1 TABLET BY MOUTH AT BEDTIME     famotidine (PEPCID) 10 MG tablet 1 tablet as needed Orally Twice a day 90 tablet 0   pantoprazole (PROTONIX) 40 MG tablet TAKE 1 TABLET BY MOUTH EVERY DAY 30 90 tablet 0   No current facility-administered medications for this visit.    Allergies-reviewed and updated Allergies  Allergen Reactions   Tramadol Palpitations and Shortness Of Breath    Other reaction(s): tachycardia   Penicillins Hives and Rash    Other reaction(s): rash    Social History   Social History Narrative  Step 3 and 3 of hers.  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 DP 2+B LUNGS: CTAB. No wheezes ABDOMEN:  BS+, soft, NTND, No HSM, no masses EXT:  no edema MSK: no gross abnormalities. MS 5/5 all 4 NEURO: A&O x3.  CN II-XII intact.  PSYCH: normal mood. Good  eye contact  Some calluses L MTP areas   Assessment and Plan   Health Maintenance counseling: 1. Anticipatory guidance: Patient counseled regarding regular dental exams q6 months, eye exams,  avoiding smoking and second hand smoke, limiting alcohol to 1 beverage per day, no illicit drugs.   2. Risk factor reduction:  Advised patient of need for regular exercise and diet rich and fruits and vegetables to reduce risk of heart attack and stroke. Exercise- some.  Wt Readings from Last 3 Encounters:  06/20/22 223 lb 4 oz (101.3 kg)  06/06/22 226 lb 8 oz (102.7 kg)  12/28/21 223 lb 12.8 oz (101.5 kg)   3. Immunizations/screenings/ancillary studies Immunization History  Administered Date(s) Administered   Influenza-Unspecified 11/23/2016, 01/29/2019   PFIZER(Purple Top)SARS-COV-2 Vaccination 03/10/2020, 04/05/2020   Tdap 11/28/2017   Health Maintenance Due  Topic Date Due   Hepatitis C Screening  Never done    4. Cervical cancer screening- utd 5. Breast cancer screening-  mammogram due in May 6. Colon cancer screening - utd 7. Skin cancer screening- advised regular sunscreen use. Denies worrisome, changing, or new skin lesions.  8. Birth control/STD check- n/a 9. Osteoporosis screening- n/a 10. Smoking associated screening - non smoker  Problem List Items Addressed This Visit       Nervous and Auditory   Osteoarthritis of spine with radiculopathy, cervical region   Relevant Orders   C-reactive protein   Sedimentation rate   Rheumatoid factor     Other   Vitamin D deficiency   Relevant Orders   VITAMIN D 25 Hydroxy (Vit-D Deficiency, Fractures)   Other Visit Diagnoses     Wellness examination    -  Primary   Relevant Orders   Comprehensive metabolic panel   Hemoglobin A1c   Lipid panel   TSH   CBC with Differential/Platelet   C-reactive protein   VITAMIN D 25 Hydroxy (Vit-D Deficiency, Fractures)   Sedimentation rate   Rheumatoid factor   Hepatitis C antibody    HIV Antibody (routine testing w rflx)   Screening for HIV without presence of risk factors       Relevant Orders   HIV Antibody (routine testing w rflx)   Encounter for hepatitis C screening test for low risk patient       Relevant Orders   Hepatitis C antibody      Wellness-anticipatory guidance.  Work on [employer].  Check CBC,CMP,lipids,TSH, A1C.  F/u 1 yr  Vit D def-check D Arthritis-mult-check crp,esr,RA F/u 3 mo wt  Recommended follow up: 33mReturn in about 3 months (around 09/20/2022) for wt, etc. No future appointments.  Lab/Order associations:fruit 2 hrs ago  fasting   ICD-10-CM   1. Wellness examination  Z00.00 Comprehensive metabolic panel    Hemoglobin A1c    Lipid panel    TSH    CBC with Differential/Platelet    C-reactive protein    VITAMIN D 25 Hydroxy (Vit-D Deficiency, Fractures)    Sedimentation rate    Rheumatoid factor    Hepatitis C antibody    HIV Antibody (routine testing w rflx)    2. Osteoarthritis of spine with radiculopathy, cervical region  M47.22 C-reactive protein    Sedimentation rate    Rheumatoid factor    3. Vitamin D deficiency  E55.9 VITAMIN D 25 Hydroxy (Vit-D Deficiency, Fractures)    4. Screening for HIV without presence of risk factors  Z11.4 HIV Antibody (routine testing w rflx)    5. Encounter for hepatitis C screening test for low risk patient  Z11.59 Hepatitis C antibody      Meds ordered this encounter  Medications   pantoprazole (PROTONIX) 40 MG tablet    Sig: TAKE 1 TABLET BY MOUTH EVERY DAY 30    Dispense:  90 tablet    Refill:  0   famotidine (PEPCID) 10 MG tablet    Sig: 1 tablet as needed Orally Twice a day    Dispense:  90 tablet    Refill:  0    Wellington Hampshire, MD

## 2022-06-20 NOTE — Patient Instructions (Addendum)
It was very nice to see you today!  Work on exercise  Urea cream or lac hydrin Metatarsal pad   PLEASE NOTE:  If you had any lab tests please let us know if you have not heard back within a few days. You may see your results on MyChart before we have a chance to review them but we will give you a call once they are reviewed by [place]. If we ordered any referrals today, please let us know if you have not heard from their office within the next week.   Please try these tips to maintain a healthy lifestyle:  Eat most of your calories during the day when you are active. Eliminate processed foods including packaged sweets (pies, cakes, cookies), reduce intake of potatoes, white bread, white pasta, and white rice. Look for whole grain options, oat flour or almond flour.  Each meal should contain half fruits/vegetables, one quarter protein, and one quarter carbs (no bigger than a computer mouse).  Cut down on sweet beverages. This includes juice, soda, and sweet tea. Also watch fruit intake, though this is a healthier sweet option, it still contains natural sugar! Limit to 3 servings daily.  Drink at least 1 glass of water with each meal and aim for at least 8 glasses per day  Exercise at least 150 minutes every week.

## 2022-06-21 LAB — CBC WITH DIFFERENTIAL/PLATELET
Basophils Absolute: 0.1 10*3/uL (ref 0.0–0.1)
Basophils Relative: 0.6 % (ref 0.0–3.0)
Eosinophils Absolute: 0.1 10*3/uL (ref 0.0–0.7)
Eosinophils Relative: 1.1 % (ref 0.0–5.0)
HCT: 39.9 % (ref 36.0–46.0)
Hemoglobin: 13.4 g/dL (ref 12.0–15.0)
Lymphocytes Relative: 36.1 % (ref 12.0–46.0)
Lymphs Abs: 3.4 10*3/uL (ref 0.7–4.0)
MCHC: 33.6 g/dL (ref 30.0–36.0)
MCV: 91.4 fl (ref 78.0–100.0)
Monocytes Absolute: 0.4 10*3/uL (ref 0.1–1.0)
Monocytes Relative: 3.9 % (ref 3.0–12.0)
Neutro Abs: 5.5 10*3/uL (ref 1.4–7.7)
Neutrophils Relative %: 58.3 % (ref 43.0–77.0)
Platelets: 252 10*3/uL (ref 150.0–400.0)
RBC: 4.37 Mil/uL (ref 3.87–5.11)
RDW: 13.7 % (ref 11.5–15.5)
WBC: 9.4 10*3/uL (ref 4.0–10.5)

## 2022-06-21 LAB — COMPREHENSIVE METABOLIC PANEL
ALT: 19 U/L (ref 0–35)
AST: 18 U/L (ref 0–37)
Albumin: 4 g/dL (ref 3.5–5.2)
Alkaline Phosphatase: 85 U/L (ref 39–117)
BUN: 10 mg/dL (ref 6–23)
CO2: 29 mEq/L (ref 19–32)
Calcium: 9.5 mg/dL (ref 8.4–10.5)
Chloride: 103 mEq/L (ref 96–112)
Creatinine, Ser: 0.81 mg/dL (ref 0.40–1.20)
GFR: 84 mL/min (ref >60.00)
Glucose, Bld: 104 mg/dL — ABNORMAL HIGH (ref 70–99)
Potassium: 3.9 mEq/L (ref 3.5–5.1)
Sodium: 138 mEq/L (ref 135–145)
Total Bilirubin: 0.4 mg/dL (ref 0.2–1.2)
Total Protein: 7.4 g/dL (ref 6.0–8.3)

## 2022-06-21 LAB — HEPATITIS C ANTIBODY: Hepatitis C Ab: NONREACTIVE

## 2022-06-21 LAB — LIPID PANEL
Cholesterol: 204 mg/dL — ABNORMAL HIGH (ref 0–200)
HDL: 41.6 mg/dL (ref >39.00)
NonHDL: 162.68
Total CHOL/HDL Ratio: 5
Triglycerides: 214 mg/dL — ABNORMAL HIGH (ref 0.0–149.0)
VLDL: 42.8 mg/dL — ABNORMAL HIGH (ref 0.0–40.0)

## 2022-06-21 LAB — HEMOGLOBIN A1C: Hgb A1c MFr Bld: 5.9 % (ref 4.6–6.5)

## 2022-06-21 LAB — LDL CHOLESTEROL, DIRECT: Direct LDL: 122 mg/dL

## 2022-06-21 LAB — SEDIMENTATION RATE: Sed Rate: 53 mm/hr — ABNORMAL HIGH (ref 0–30)

## 2022-06-21 LAB — C-REACTIVE PROTEIN: CRP: <1 mg/dL (ref 0.5–20.0)

## 2022-06-21 LAB — TSH: TSH: 0.87 u[IU]/mL (ref 0.35–5.50)

## 2022-06-21 LAB — VITAMIN D 25 HYDROXY (VIT D DEFICIENCY, FRACTURES): VITD: 24.06 ng/mL — ABNORMAL LOW (ref 30.00–100.00)

## 2022-06-21 LAB — HIV ANTIBODY (ROUTINE TESTING W REFLEX): HIV 1&2 Ab, 4th Generation: NONREACTIVE

## 2022-06-21 LAB — RHEUMATOID FACTOR: Rheumatoid fact SerPl-aCnc: <14 IU/mL (ref <14)

## 2022-06-24 NOTE — Progress Notes (Signed)
Labs-no panic, but please schedule appointment (can be virtual) to discuss abnormalities and plan

## 2022-06-27 ENCOUNTER — Telehealth (INDEPENDENT_AMBULATORY_CARE_PROVIDER_SITE_OTHER): Payer: BC Managed Care – PPO | Admitting: Family Medicine

## 2022-06-27 ENCOUNTER — Encounter: Payer: Self-pay | Admitting: Family Medicine

## 2022-06-27 DIAGNOSIS — M25561 Pain in right knee: Secondary | ICD-10-CM | POA: Diagnosis not present

## 2022-06-27 DIAGNOSIS — E782 Mixed hyperlipidemia: Secondary | ICD-10-CM

## 2022-06-27 DIAGNOSIS — M25562 Pain in left knee: Secondary | ICD-10-CM

## 2022-06-27 DIAGNOSIS — G8929 Other chronic pain: Secondary | ICD-10-CM

## 2022-06-27 DIAGNOSIS — E559 Vitamin D deficiency, unspecified: Secondary | ICD-10-CM | POA: Diagnosis not present

## 2022-06-27 DIAGNOSIS — R7303 Prediabetes: Secondary | ICD-10-CM | POA: Diagnosis not present

## 2022-06-27 NOTE — Patient Instructions (Addendum)
[facility] at [place] on the 1st floor Phone number [phone]   Vitamin D 2000 iu/d  Cut back on sugars and starches.    Omega 3 supplements-fish oil/flax seeds or oil

## 2022-06-27 NOTE — Progress Notes (Signed)
MyChart Video Visit    Virtual Visit via Video Note   This visit type was conducted  This format is felt to be most appropriate for this patient at this time. Physical exam was limited by quality of the video and audio technology used for the visit. CMA was able to get the patient set up on a video visit.  Patient location: Home. Patient and provider in visit Provider location: Office  I discussed the limitations of evaluation and management by telemedicine and the availability of in person appointments. The patient expressed understanding and agreed to proceed.  Visit Date: 06/27/2022  Today's healthcare provider: Wellington Hampshire, MD     Subjective:    Patient ID: Natasha Murphy, female    DOB: 16-Feb-1971, 52 y.o.   MRN: [id number]  Chief Complaint  Patient presents with   Results    Discuss recent lab results and treatment options    HPI Discuss labs Not taking D   Past Medical History:  Diagnosis Date   Depression 2015   GERD (gastroesophageal reflux disease) 2020   Heart murmur 1995   HSV-2 seropositive    Hx of chlamydia infection 1995, 01/2009   Hx of gonorrhea 1995   Lactose intolerance    Varicose veins    Vitamin D deficiency     Past Surgical History:  Procedure Laterality Date   CESAREAN SECTION WITH BILATERAL TUBAL LIGATION     UMBILICAL HERNIA REPAIR  age 10    Outpatient Medications Prior to Visit  Medication Sig Dispense Refill   acetaminophen (TYLENOL) 500 MG tablet Take 500-1,000 mg by mouth every 6 (six) hours as needed for moderate pain.     Ascorbic Acid (VITAMIN C) 500 MG CAPS See admin instructions.     cetirizine (ZYRTEC ALLERGY) 10 MG tablet Take 10 mg by mouth daily as needed for allergies.      Cholecalciferol (VITAMIN D) 2000 units CAPS Take 1 capsule by mouth daily.     clindamycin (CLEOCIN) 150 MG capsule Take 150 mg by mouth. 1 tablet by mouth every 6 hours     famotidine (PEPCID) 10 MG tablet 1 tablet as needed Orally Twice a  day 90 tablet 0   ibuprofen (ADVIL) 600 MG tablet ibuprofen 600 mg tablet  TAKE 1 TABLET BY MOUTH EVERY 6 TO 8 HOURS AS NEEDED     Multiple Vitamin (MULTIVITAMIN ADULT PO) Take by mouth.     pantoprazole (PROTONIX) 40 MG tablet TAKE 1 TABLET BY MOUTH EVERY DAY 30 90 tablet 0   sertraline (ZOLOFT) 50 MG tablet Take 1 tablet by mouth daily.     traZODone (DESYREL) 50 MG tablet trazodone 50 mg tablet  TAKE 1 TABLET BY MOUTH AT BEDTIME     No facility-administered medications prior to visit.    Allergies  Allergen Reactions   Tramadol Palpitations and Shortness Of Breath    Other reaction(s): tachycardia   Penicillins Hives and Rash    Other reaction(s): rash        Objective:     Physical Exam  Vitals and nursing note reviewed.  Constitutional:      General:  is not in acute distress.    Appearance: Normal appearance.  HENT:     Head: Normocephalic.  Pulmonary:     Effort: No respiratory distress.  Skin:    General: Skin is dry.     Coloration: Skin is not pale.  Neurological:     Mental Status: Pt  is alert and oriented to person, place, and time.  Psychiatric:        Mood and Affect: Mood normal.   LMP 07/12/2021 (Approximate)   Wt Readings from Last 3 Encounters:  06/20/22 223 lb 4 oz (101.3 kg)  06/06/22 226 lb 8 oz (102.7 kg)  12/28/21 223 lb 12.8 oz (101.5 kg)   Discussed labs     Assessment & Plan:   Problem List Items Addressed This Visit       Other   Pain in both knees - Primary   Vitamin D deficiency   Pre-diabetes   Other Visit Diagnoses     Elevated cholesterol with elevated triglycerides          Pain both knees.  ESR 53.  RA neg.  Inflammation somewhere in body.  Advised to f/u sports med-info give in after visit summary.    May be coming from other source as well.  Discussed diet/exercise Vitamin D def-chronic  D is 24-advised to take 2000 iu/d PreDM-work on diet/exercise to reduce risk of progression to dm Elevated trigs-discussed  diet/exercise-lower carbs/fats.  Fish oil/flax seed oil  No orders of the defined types were placed in this encounter.   I discussed the assessment and treatment plan with the patient. The patient was provided an opportunity to ask questions and all were answered. The patient agreed with the plan and demonstrated an understanding of the instructions.   The patient was advised to call back or seek an in-person evaluation if the symptoms worsen or if the condition fails to improve as anticipated.    Wellington Hampshire, MD [place] [phone] (phone) [phone] (fax)  [place]

## 2022-09-21 ENCOUNTER — Ambulatory Visit
Admission: RE | Admit: 2022-09-21 | Discharge: 2022-09-21 | Disposition: A | Discharge status: Home / Self Care | Payer: BC Managed Care – PPO | Source: Ambulatory Visit | Attending: Family Medicine | Admitting: Family Medicine

## 2022-09-21 ENCOUNTER — Other Ambulatory Visit: Payer: Self-pay | Admitting: Family Medicine

## 2022-09-21 ENCOUNTER — Ambulatory Visit (INDEPENDENT_AMBULATORY_CARE_PROVIDER_SITE_OTHER): Payer: BC Managed Care – PPO | Admitting: Family Medicine

## 2022-09-21 ENCOUNTER — Encounter: Payer: Self-pay | Admitting: Family Medicine

## 2022-09-21 VITALS — BP 116/80 | HR 61 | Temp 97.7°F | Resp 18 | Ht 67.0 in | Wt 223.5 lb

## 2022-09-21 DIAGNOSIS — Z0000 Encounter for general adult medical examination without abnormal findings: Secondary | ICD-10-CM

## 2022-09-21 DIAGNOSIS — M25561 Pain in right knee: Secondary | ICD-10-CM

## 2022-09-21 DIAGNOSIS — R600 Localized edema: Secondary | ICD-10-CM

## 2022-09-21 DIAGNOSIS — G8929 Other chronic pain: Secondary | ICD-10-CM

## 2022-09-21 DIAGNOSIS — R7303 Prediabetes: Secondary | ICD-10-CM

## 2022-09-21 DIAGNOSIS — M25562 Pain in left knee: Secondary | ICD-10-CM

## 2022-09-21 DIAGNOSIS — M5416 Radiculopathy, lumbar region: Secondary | ICD-10-CM | POA: Diagnosis not present

## 2022-09-21 LAB — COMPREHENSIVE METABOLIC PANEL
ALT: 19 U/L (ref 0–35)
AST: 17 U/L (ref 0–37)
Albumin: 4.2 g/dL (ref 3.5–5.2)
Alkaline Phosphatase: 79 U/L (ref 39–117)
BUN: 11 mg/dL (ref 6–23)
CO2: 29 mEq/L (ref 19–32)
Calcium: 9.6 mg/dL (ref 8.4–10.5)
Chloride: 103 mEq/L (ref 96–112)
Creatinine, Ser: 0.78 mg/dL (ref 0.40–1.20)
GFR: 87.74 mL/min (ref >60.00)
Glucose, Bld: 91 mg/dL (ref 70–99)
Potassium: 4.3 mEq/L (ref 3.5–5.1)
Sodium: 141 mEq/L (ref 135–145)
Total Bilirubin: 0.6 mg/dL (ref 0.2–1.2)
Total Protein: 7.7 g/dL (ref 6.0–8.3)

## 2022-09-21 LAB — HEMOGLOBIN A1C: Hgb A1c MFr Bld: 5.8 % (ref 4.6–6.5)

## 2022-09-21 LAB — LIPID PANEL
Cholesterol: 224 mg/dL — ABNORMAL HIGH (ref 0–200)
HDL: 42.1 mg/dL (ref >39.00)
NonHDL: 181.58
Total CHOL/HDL Ratio: 5
Triglycerides: 294 mg/dL — ABNORMAL HIGH (ref 0.0–149.0)
VLDL: 58.8 mg/dL — ABNORMAL HIGH (ref 0.0–40.0)

## 2022-09-21 LAB — LDL CHOLESTEROL, DIRECT: Direct LDL: 133 mg/dL

## 2022-09-21 NOTE — Patient Instructions (Signed)
[facility] at [place]  [address] on the 1st floor Phone number [phone]  

## 2022-09-21 NOTE — Progress Notes (Unsigned)
Subjective:     Patient ID: Natasha Murphy, female    DOB: September 20, 1970, 52 y.o.   MRN: [id number]  Chief Complaint  Patient presents with   Medical Management of Chronic Issues    3 month follow-up on HTN, predm Fasting Ankles swelling, having some numbness at time Discuss PT referral     Some numbness left 4th and 5th toes  Knee pain-wants to go to [facility] for physical therapy on [address]. Edema ankles at times.  Left foot swells at times.  More when travels.intermitt. PreDM-some changes on diet.  Inconsistent exercise.    There are no preventive care reminders to display for this patient.  Past Medical History:  Diagnosis Date   Depression 2015   GERD (gastroesophageal reflux disease) 2020   Heart murmur 1995   HSV-2 seropositive    Hx of chlamydia infection 1995, 01/2009   Hx of gonorrhea 1995   Lactose intolerance    Varicose veins    Vitamin D deficiency     Past Surgical History:  Procedure Laterality Date   CESAREAN SECTION WITH BILATERAL TUBAL LIGATION     UMBILICAL HERNIA REPAIR  age 52     Current Outpatient Medications:    acetaminophen (TYLENOL) 500 MG tablet, Take 500-1,000 mg by mouth every 6 (six) hours as needed for moderate pain., Disp: , Rfl:    Ascorbic Acid (VITAMIN C) 500 MG CAPS, See admin instructions., Disp: , Rfl:    cetirizine (ZYRTEC ALLERGY) 10 MG tablet, Take 10 mg by mouth daily as needed for allergies. , Disp: , Rfl:    Cholecalciferol (VITAMIN D) 2000 units CAPS, Take 1 capsule by mouth daily., Disp: , Rfl:    ibuprofen (ADVIL) 600 MG tablet, ibuprofen 600 mg tablet  TAKE 1 TABLET BY MOUTH EVERY 6 TO 8 HOURS AS NEEDED, Disp: , Rfl:    Multiple Vitamin (MULTIVITAMIN ADULT PO), Take by mouth., Disp: , Rfl:    sertraline (ZOLOFT) 50 MG tablet, Take 1 tablet by mouth daily., Disp: , Rfl:    traZODone (DESYREL) 50 MG tablet, trazodone 50 mg tablet  TAKE 1 TABLET BY MOUTH AT BEDTIME, Disp: , Rfl:   Allergies  Allergen  Reactions   Tramadol Palpitations and Shortness Of Breath    Other reaction(s): tachycardia   Penicillins Hives and Rash    Other reaction(s): rash   ROS neg/noncontributory except as noted HPI/below      Objective:     BP 116/80   Pulse 61   Temp 97.7 F (36.5 C) (Temporal)   Resp 18   Ht 5\' 7"  (1.702 m)   Wt 223 lb 8 oz (101.4 kg)   LMP 07/12/2021 (Approximate)   SpO2 100%   BMI 35.01 kg/m  Wt Readings from Last 3 Encounters:  09/21/22 223 lb 8 oz (101.4 kg)  06/20/22 223 lb 4 oz (101.3 kg)  06/06/22 226 lb 8 oz (102.7 kg)    Physical Exam   Gen: WDWN NAD HEENT: NCAT, conjunctiva not injected, sclera nonicteric NECK:  supple, no thyromegaly, no nodes, no carotid bruits CARDIAC: RRR, S1S2+, no murmur. DP 1+B LUNGS: CTAB. No wheezes ABDOMEN:  BS+, soft, NTND, No HSM, no masses EXT:  no edema MSK: no gross abnormalities.  NEURO: A&O x3.  CN II-XII intact.  PSYCH: normal mood. Good eye contact  Reviewed dopplers     Assessment & Plan:  Pre-diabetes Assessment & Plan:  Chronic.  Working some on diet, inconsistent on exercise.  Spent  some time discussing dietary changes and need for exercise.  Check labs  Orders: -     Comprehensive metabolic panel -     Lipid panel -     Hemoglobin A1c  Chronic pain of both knees Assessment & Plan: Chronic.  She was referred to physical therapy in the past, she would like to go to [facility] physical therapy.  Will refer.  May need to see sports medicine/Ortho  Orders: -     Ambulatory referral to Physical Therapy  Local edema Assessment & Plan: Left ankle greater than right.  Reviewed vascular studies done in 2018-she did had some venous reflux in the right lower extremity.  Left was not checked.  I discussed what this meant.  She actually did see vascular back then as well.  Discussed need to elevate legs, wear compression stockings, work on weight loss.  Could do some calf raises if cannot be active every hour to try  to get the blood flow back.   Lumbar radiculopathy  Other orders -     LDL cholesterol, direct  Lumbar radiculopathy-causing some numbness left lateral toes.  Discussed that I doubt it is an arterial problem.  Follow-up with sports medicine/Ortho  Return for june annual next yr.  Angelena Sole, MD

## 2022-09-23 DIAGNOSIS — R600 Localized edema: Secondary | ICD-10-CM | POA: Insufficient documentation

## 2022-09-23 NOTE — Assessment & Plan Note (Signed)
Chronic.  Working some on diet, inconsistent on exercise.  Spent some time discussing dietary changes and need for exercise.  Check labs

## 2022-09-23 NOTE — Progress Notes (Signed)
Still preDiabetic and cholesterol worse.  Does she want to start on medications or work on diet/exercise and follow up 6 months?

## 2022-09-23 NOTE — Assessment & Plan Note (Signed)
Chronic.  She was referred to physical therapy in the past, she would like to go to [facility] physical therapy.  Will refer.  May need to see sports medicine/Ortho

## 2022-09-23 NOTE — Assessment & Plan Note (Signed)
Left ankle greater than right.  Reviewed vascular studies done in 2018-she did had some venous reflux in the right lower extremity.  Left was not checked.  I discussed what this meant.  She actually did see vascular back then as well.  Discussed need to elevate legs, wear compression stockings, work on weight loss.  Could do some calf raises if cannot be active every hour to try to get the blood flow back.

## 2022-09-28 ENCOUNTER — Ambulatory Visit: Payer: BC Managed Care – PPO | Admitting: Family Medicine

## 2022-12-03 ENCOUNTER — Other Ambulatory Visit: Payer: Self-pay | Admitting: *Deleted

## 2022-12-03 DIAGNOSIS — M25473 Effusion, unspecified ankle: Secondary | ICD-10-CM

## 2022-12-13 ENCOUNTER — Encounter: Payer: Self-pay | Admitting: Physician Assistant

## 2022-12-13 ENCOUNTER — Ambulatory Visit (HOSPITAL_COMMUNITY)
Admission: RE | Admit: 2022-12-13 | Discharge: 2022-12-13 | Disposition: A | Discharge status: Home / Self Care | Payer: BC Managed Care – PPO | Source: Ambulatory Visit | Attending: Vascular Surgery | Admitting: Vascular Surgery

## 2022-12-13 ENCOUNTER — Ambulatory Visit (INDEPENDENT_AMBULATORY_CARE_PROVIDER_SITE_OTHER): Payer: BC Managed Care – PPO | Admitting: Physician Assistant

## 2022-12-13 VITALS — BP 135/82 | HR 70 | Temp 97.7°F | Ht 67.0 in | Wt 223.0 lb

## 2022-12-13 DIAGNOSIS — M25473 Effusion, unspecified ankle: Secondary | ICD-10-CM

## 2022-12-13 DIAGNOSIS — M7989 Other specified soft tissue disorders: Secondary | ICD-10-CM

## 2022-12-13 DIAGNOSIS — M25472 Effusion, left ankle: Secondary | ICD-10-CM | POA: Diagnosis not present

## 2022-12-13 NOTE — Progress Notes (Signed)
Requested by:  Delfin Gant, MD [address] [place],  [place] [address]  Reason for consultation: Bilateral ankle edema   History of Present Illness   Natasha Murphy is a 52 y.o. (03-02-1971) female who presents for evaluation of bilateral ankle edema, left greater than right.  Her ankle swelling has been present and stable for several years.  Her ankles are not swollen in the morning, but after sitting all day for work they get swollen.  This causes her ankles and feet to feel somewhat heavy.  She does not elevate her legs or wear compression stockings regularly.  She has no prior history of vein procedures.   She denies any history of ulcerations or DVT.   She endorses ongoing and stable left fourth and fifth toe numbness, previously attributed to her lumbar radiculopathy.  This numbness is not affected by her ankle swelling.  She denies any symptoms of arterial disease such as claudication, rest pain, tissue loss.  She is not a smoker.  Past Medical History:  Diagnosis Date   Depression 2015   GERD (gastroesophageal reflux disease) 2020   Heart murmur 1995   HSV-2 seropositive    Hx of chlamydia infection 1995, 01/2009   Hx of gonorrhea 1995   Lactose intolerance    Varicose veins    Vitamin D deficiency     Past Surgical History:  Procedure Laterality Date   CESAREAN SECTION WITH BILATERAL TUBAL LIGATION     UMBILICAL HERNIA REPAIR  age 73    Social History   Socioeconomic History   Marital status: Married    Spouse name: Not on file   Number of children: 3   Years of education: Not on file   Highest education level: Not on file  Occupational History   Not on file  Tobacco Use   Smoking status: Never   Smokeless tobacco: Never  Vaping Use   Vaping status: Never Used  Substance and Sexual Activity   Alcohol use: Yes    Comment: occassional (twice/month)   Drug use: No   Sexual activity: Yes  Other Topics Concern   Not on file  Social  History Narrative   Step 3 and 3 of hers.  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   Social Determinants of Health   Financial Resource Strain: Not on file  Food Insecurity: Not on file  Transportation Needs: Not on file  Physical Activity: Not on file  Stress: Not on file  Social Connections: Not on file  Intimate Partner Violence: Not on file    Family History  Problem Relation Age of Onset   Hypertension Mother    Depression Mother    Anxiety disorder Mother    Cancer Paternal Aunt    Cancer Paternal Grandmother    Breast cancer Neg Hx     Current Outpatient Medications  Medication Sig Dispense Refill   acetaminophen (TYLENOL) 500 MG tablet Take 500-1,000 mg by mouth every 6 (six) hours as needed for moderate pain.     Ascorbic Acid (VITAMIN C) 500 MG CAPS See admin instructions.     cetirizine (ZYRTEC ALLERGY) 10 MG tablet Take 10 mg by mouth daily as needed for allergies.      Cholecalciferol (VITAMIN D) 2000 units CAPS Take 1 capsule by mouth daily.     ibuprofen (ADVIL) 600 MG tablet ibuprofen 600 mg tablet  TAKE 1 TABLET BY MOUTH EVERY 6 TO 8 HOURS AS NEEDED  Multiple Vitamin (MULTIVITAMIN ADULT PO) Take by mouth.     sertraline (ZOLOFT) 50 MG tablet Take 1 tablet by mouth daily.     traZODone (DESYREL) 50 MG tablet trazodone 50 mg tablet  TAKE 1 TABLET BY MOUTH AT BEDTIME     No current facility-administered medications for this visit.    Allergies  Allergen Reactions   Tramadol Palpitations and Shortness Of Breath    Other reaction(s): tachycardia   Penicillins Hives and Rash    Other reaction(s): rash    REVIEW OF SYSTEMS (negative unless checked):   Cardiac:  []  Chest pain or chest pressure? []  Shortness of breath upon activity? []  Shortness of breath when lying flat? []  Irregular heart rhythm?  Vascular:  []  Pain in calf, thigh, or hip brought on by walking? []  Pain in feet at night that wakes you up from your sleep? []  Blood clot in your  veins? [x]  Leg swelling?  Pulmonary:  []  Oxygen at home? []  Productive cough? []  Wheezing?  Neurologic:  []  Sudden weakness in arms or legs? []  Sudden numbness in arms or legs? []  Sudden onset of difficult speaking or slurred speech? []  Temporary loss of vision in one eye? []  Problems with dizziness?  Gastrointestinal:  []  Blood in stool? []  Vomited blood?  Genitourinary:  []  Burning when urinating? []  Blood in urine?  Psychiatric:  []  Major depression  Hematologic:  []  Bleeding problems? []  Problems with blood clotting?  Dermatologic:  []  Rashes or ulcers?  Constitutional:  []  Fever or chills?  Ear/Nose/Throat:  []  Change in hearing? []  Nose bleeds? []  Sore throat?  Musculoskeletal:  [x]  Back pain? []  Joint pain? []  Muscle pain?   Physical Examination     Vitals:   12/13/22 1309  BP: 135/82  Pulse: 70  Temp: 97.7 F (36.5 C)  TempSrc: Temporal  SpO2: 98%  Weight: 223 lb (101.2 kg)  Height: 5\' 7"  (1.702 m)   Body mass index is 34.93 kg/m.  General:  WDWN in NAD; vital signs documented above Gait: Not observed HENT: WNL, normocephalic Pulmonary: normal non-labored breathing , without Rales, rhonchi,  wheezing Cardiac: regular Abdomen: soft, NT, no masses Skin: without rashes Vascular Exam/Pulses: palpable DP pulses bilaterally Extremities: without varicose veins, with scattered reticular veins, with trace left ankle edema, without stasis pigmentation, without lipodermatosclerosis, without ulcers Musculoskeletal: no muscle wasting or atrophy  Neurologic: A&O X 3;  No focal weakness or paresthesias are detected Psychiatric:  The pt has Normal affect.  Non-invasive Vascular Imaging   LLE Venous Insufficiency Duplex (12/13/2022):  LEFT          Reflux NoRefluxReflux TimeDiameter cmsComments                               Yes                                         +--------------+---------+------+-----------+------------+-------------+  CFV          no                                                   +--------------+---------+------+-----------+------------+-------------+  FV prox       no                                                   +--------------+---------+------+-----------+------------+-------------+  FV mid        no                                                   +--------------+---------+------+-----------+------------+-------------+  FV dist       no                                                   +--------------+---------+------+-----------+------------+-------------+  Popliteal    no                                                   +--------------+---------+------+-----------+------------+-------------+  GSV at [facility]    no                           0.615                   +--------------+---------+------+-----------+------------+-------------+  GSV prox thighno                           0.372                   +--------------+---------+------+-----------+------------+-------------+  GSV mid thigh no                           0.297    out of fascia  +--------------+---------+------+-----------+------------+-------------+  GSV dist thighno                           0.265                   +--------------+---------+------+-----------+------------+-------------+  GSV at knee   no                           0.279                   +--------------+---------+------+-----------+------------+-------------+  GSV prox calf no                           0.151                   +--------------+---------+------+-----------+------------+-------------+  SSV Pop Fossa           yes    >500 ms     0.643                   +--------------+---------+------+-----------+------------+-------------+  SSV prox calf           yes    >500 ms     0.451                    +--------------+---------+------+-----------+------------+-------------+  SSV mid calf            yes    >500 ms     0.438                   +--------------+---------+------+-----------+------------+-------------+  Medical Decision Making   Natasha Murphy is a 52 y.o. female who presents bilateral ankle swelling  Based on the patient's duplex there is reflux in the left small saphenous vein from the popliteal fossa to the mid calf.  This vein is fairly dilated and greater than 4 mm of the thigh, making it a candidate for ablation.  There is no reflux in the remainder of the deep or superficial venous system. She endorses several years of bilateral ankle swelling, left greater than right.  Her swelling is usually worse at the end the day after sitting down all day for her job.  She does not elevate her legs.  She only wears compression stockings for long travel. She endorses a stable and ongoing history of left fourth and fifth toe numbness.  This is not worsened by or associated with her leg swelling.  Her numbness is likely due to her history of lumbar radiculopathy On exam she has palpable DP pulses bilaterally.  She currently has little to no swelling of either of her ankles. I explained to the patient she could potentially be a candidate for small saphenous vein ablation but she is not interested in this currently.  She was measured for 15 to 20 mmHg knee-high compression stockings.  I recommend that she wears these daily, elevate her legs at night, and exercise to help with leg swelling. She can follow-up with our office as needed   Ernestene Mention, PA-C [facility] Office: [phone]  12/13/2022, 1:39 PM  Clinic MD: Edilia Bo

## 2023-01-25 ENCOUNTER — Encounter: Payer: Self-pay | Admitting: Family Medicine

## 2023-01-25 ENCOUNTER — Ambulatory Visit: Payer: BC Managed Care – PPO | Admitting: Family Medicine

## 2023-01-25 VITALS — BP 118/80 | HR 86 | Temp 97.1°F | Ht 67.0 in | Wt 219.8 lb

## 2023-01-25 DIAGNOSIS — L84 Corns and callosities: Secondary | ICD-10-CM | POA: Diagnosis not present

## 2023-01-25 DIAGNOSIS — R200 Anesthesia of skin: Secondary | ICD-10-CM | POA: Diagnosis not present

## 2023-01-25 NOTE — Patient Instructions (Signed)
Call and schedule w/podiatrist for calluses and numbness and injury

## 2023-01-25 NOTE — Progress Notes (Signed)
Subjective:     Patient ID: Natasha Murphy, female    DOB: 03/22/1971, 52 y.o.   MRN: [id number]  Chief Complaint  Patient presents with   Toe Pain    Pt c/o of left toe pain by hitting on dresser 2 days ago.     HPI  Toe pain - She reports pain and numbness in her left 4th and 5th toes after hitting them on her wooden dresser 2 days ago. Endorses more numbness than pain. Reports previous numbness in left 5th toe prior to this incident. Has not tried icing her toes. She has not been seen by podiatry for previous numbness or callus.    There are no preventive care reminders to display for this patient.   Past Medical History:  Diagnosis Date   Depression 2015   GERD (gastroesophageal reflux disease) 2020   Heart murmur 1995   HSV-2 seropositive    Hx of chlamydia infection 1995, 01/2009   Hx of gonorrhea 1995   Lactose intolerance    Varicose veins    Vitamin D deficiency     Past Surgical History:  Procedure Laterality Date   CESAREAN SECTION WITH BILATERAL TUBAL LIGATION     UMBILICAL HERNIA REPAIR  age 88     Current Outpatient Medications:    acetaminophen (TYLENOL) 500 MG tablet, Take 500-1,000 mg by mouth every 6 (six) hours as needed for moderate pain., Disp: , Rfl:    Ascorbic Acid (VITAMIN C) 500 MG CAPS, See admin instructions., Disp: , Rfl:    cetirizine (ZYRTEC ALLERGY) 10 MG tablet, Take 10 mg by mouth daily as needed for allergies. , Disp: , Rfl:    Cholecalciferol (VITAMIN D) 2000 units CAPS, Take 1 capsule by mouth daily., Disp: , Rfl:    ibuprofen (ADVIL) 600 MG tablet, ibuprofen 600 mg tablet  TAKE 1 TABLET BY MOUTH EVERY 6 TO 8 HOURS AS NEEDED, Disp: , Rfl:    Multiple Vitamin (MULTIVITAMIN ADULT PO), Take by mouth., Disp: , Rfl:    sertraline (ZOLOFT) 50 MG tablet, Take 1 tablet by mouth daily., Disp: , Rfl:    traZODone (DESYREL) 50 MG tablet, trazodone 50 mg tablet  TAKE 1 TABLET BY MOUTH AT BEDTIME, Disp: , Rfl:   Allergies  Allergen  Reactions   Tramadol Palpitations and Shortness Of Breath    Other reaction(s): tachycardia   Penicillins Hives and Rash    Other reaction(s): rash   ROS neg/noncontributory except as noted HPI/below      Objective:     BP 118/80   Pulse 86   Temp (!) 97.1 F (36.2 C)   Ht 5\' 7"  (1.702 m)   Wt 219 lb 12.8 oz (99.7 kg)   LMP 07/12/2021 (Approximate)   SpO2 97%   BMI 34.43 kg/m  Wt Readings from Last 3 Encounters:  01/25/23 219 lb 12.8 oz (99.7 kg)  12/13/22 223 lb (101.2 kg)  09/21/22 223 lb 8 oz (101.4 kg)    Physical Exam   Gen: WDWN NAD HEENT: NCAT, conjunctiva not injected, sclera nonicteric EXT:  no edema.  DP and PT 1+ MSK: no gross abnormalities.  NEURO: A&O x3.  CN II-XII intact.  PSYCH: normal mood. Good eye contact  Calluses on base of MTPs. Left 5th toe a little swollen. Some numbness but still with intact sensation to light touch. Minimal pian of the left 5th toe. No pain on left 4th toe. No bruising. Toes are warm and pink.  Assessment & Plan:  Numbness of toes  Callus under metatarsal head   Numbness L 4-5 toes.5th is chronic, but 4th new and  Worse on 5th since injury.  Ice, elevation.  See pod. Can buddy tape if uncomfortable Calluses under MTP.  See pod-suspect mechanical issue as well to cause them  Return if symptoms worsen or fail to improve.  I, Isabelle Course, acting as a scribe for Angelena Sole, MD., have documented all relevant documentation on the behalf of Angelena Sole, MD, as directed by  Angelena Sole, MD while in the presence of Angelena Sole, MD.  I, Angelena Sole, MD, have reviewed all documentation for this visit. The documentation on 01/25/23 for the exam, diagnosis, procedures, and orders are all accurate and complete.    Angelena Sole, MD

## 2023-01-31 ENCOUNTER — Encounter: Payer: Self-pay | Admitting: Podiatry

## 2023-01-31 ENCOUNTER — Ambulatory Visit (INDEPENDENT_AMBULATORY_CARE_PROVIDER_SITE_OTHER): Payer: BC Managed Care – PPO | Admitting: Podiatry

## 2023-01-31 ENCOUNTER — Ambulatory Visit (INDEPENDENT_AMBULATORY_CARE_PROVIDER_SITE_OTHER): Payer: BC Managed Care – PPO

## 2023-01-31 DIAGNOSIS — S92512A Displaced fracture of proximal phalanx of left lesser toe(s), initial encounter for closed fracture: Secondary | ICD-10-CM | POA: Diagnosis not present

## 2023-01-31 DIAGNOSIS — M778 Other enthesopathies, not elsewhere classified: Secondary | ICD-10-CM | POA: Diagnosis not present

## 2023-02-01 NOTE — Progress Notes (Signed)
Subjective:   Patient ID: Natasha Murphy, female   DOB: 52 y.o.   MRN: [id number]   HPI Patient states that she injured her left fifth toe around a week ago and then again yesterday and it has been very sore she has chronic plantar keratotic lesions bilateral at times can be sore but she can keep under control.  Patient does not smoke likes to be active   Review of Systems  All other systems reviewed and are negative.       Objective:  Physical Exam Vitals and nursing note reviewed.  Constitutional:      Appearance: She is well-developed.  Pulmonary:     Effort: Pulmonary effort is normal.  Musculoskeletal:        General: Normal range of motion.  Skin:    General: Skin is warm.  Neurological:     Mental Status: She is alert.     Neurovascular status intact muscle strength found to be adequate range of motion adequate with the patient found to have swollen left fifth digit that is painful when pressed with patient noted to have lesion formation plantar bilateral of a very thin nature     Assessment:  Probability for fractured fifth toe left secondary to injury and also chronic plantar keratotic lesions     Plan:  H&P x-ray left reviewed discussed the fracture of the digit and do not recommend further treatment as it is not displaced and recommended wider shoes.  As far as lesions do not recommend treatment as they have done well and she will continue to just work on them  X-rays indicate that there is a fracture proximal phalanx digit 5 left does not involve the joint surface does not appear displaced

## 2023-02-27 ENCOUNTER — Ambulatory Visit (INDEPENDENT_AMBULATORY_CARE_PROVIDER_SITE_OTHER): Payer: BC Managed Care – PPO | Admitting: Family Medicine

## 2023-02-27 VITALS — BP 117/76 | HR 92 | Temp 98.0°F | Resp 18 | Ht 67.0 in | Wt 224.2 lb

## 2023-02-27 DIAGNOSIS — M25562 Pain in left knee: Secondary | ICD-10-CM

## 2023-02-27 DIAGNOSIS — E559 Vitamin D deficiency, unspecified: Secondary | ICD-10-CM

## 2023-02-27 DIAGNOSIS — R700 Elevated erythrocyte sedimentation rate: Secondary | ICD-10-CM | POA: Diagnosis not present

## 2023-02-27 DIAGNOSIS — R509 Fever, unspecified: Secondary | ICD-10-CM

## 2023-02-27 DIAGNOSIS — R7303 Prediabetes: Secondary | ICD-10-CM | POA: Diagnosis not present

## 2023-02-27 DIAGNOSIS — G8929 Other chronic pain: Secondary | ICD-10-CM

## 2023-02-27 DIAGNOSIS — M25561 Pain in right knee: Secondary | ICD-10-CM

## 2023-02-27 DIAGNOSIS — E782 Mixed hyperlipidemia: Secondary | ICD-10-CM | POA: Diagnosis not present

## 2023-02-27 LAB — POC URINALSYSI DIPSTICK (AUTOMATED)
Bilirubin, UA: NEGATIVE
Blood, UA: NEGATIVE
Glucose, UA: NEGATIVE
Ketones, UA: NEGATIVE
Leukocytes, UA: NEGATIVE
Nitrite, UA: NEGATIVE
Protein, UA: NEGATIVE
Spec Grav, UA: <=1.005 — AB (ref 1.010–1.025)
Urobilinogen, UA: 1 U/dL
pH, UA: 6 (ref 5.0–8.0)

## 2023-02-27 LAB — VITAMIN D 25 HYDROXY (VIT D DEFICIENCY, FRACTURES): VITD: 32.7 ng/mL (ref 30.00–100.00)

## 2023-02-27 LAB — CBC WITH DIFFERENTIAL/PLATELET
Basophils Absolute: 0.1 10*3/uL (ref 0.0–0.1)
Basophils Relative: 0.7 % (ref 0.0–3.0)
Eosinophils Absolute: 0.1 10*3/uL (ref 0.0–0.7)
Eosinophils Relative: 0.7 % (ref 0.0–5.0)
HCT: 39.3 % (ref 36.0–46.0)
Hemoglobin: 13.2 g/dL (ref 12.0–15.0)
Lymphocytes Relative: 53 % — ABNORMAL HIGH (ref 12.0–46.0)
Lymphs Abs: 4.2 10*3/uL — ABNORMAL HIGH (ref 0.7–4.0)
MCHC: 33.5 g/dL (ref 30.0–36.0)
MCV: 90.8 fL (ref 78.0–100.0)
Monocytes Absolute: 0.4 10*3/uL (ref 0.1–1.0)
Monocytes Relative: 5.4 % (ref 3.0–12.0)
Neutro Abs: 3.2 10*3/uL (ref 1.4–7.7)
Neutrophils Relative %: 40.2 % — ABNORMAL LOW (ref 43.0–77.0)
Platelets: 182 10*3/uL (ref 150.0–400.0)
RBC: 4.33 Mil/uL (ref 3.87–5.11)
RDW: 13.9 % (ref 11.5–15.5)
WBC: 7.9 10*3/uL (ref 4.0–10.5)

## 2023-02-27 LAB — C-REACTIVE PROTEIN: CRP: 3.4 mg/dL (ref 0.5–20.0)

## 2023-02-27 LAB — COMPREHENSIVE METABOLIC PANEL
ALT: 316 U/L — ABNORMAL HIGH (ref 0–35)
AST: 425 U/L — ABNORMAL HIGH (ref 0–37)
Albumin: 3.8 g/dL (ref 3.5–5.2)
Alkaline Phosphatase: 114 U/L (ref 39–117)
BUN: 9 mg/dL (ref 6–23)
CO2: 30 meq/L (ref 19–32)
Calcium: 9.3 mg/dL (ref 8.4–10.5)
Chloride: 100 meq/L (ref 96–112)
Creatinine, Ser: 0.85 mg/dL (ref 0.40–1.20)
GFR: 78.9 mL/min (ref >60.00)
Glucose, Bld: 103 mg/dL — ABNORMAL HIGH (ref 70–99)
Potassium: 3.8 meq/L (ref 3.5–5.1)
Sodium: 136 meq/L (ref 135–145)
Total Bilirubin: 0.6 mg/dL (ref 0.2–1.2)
Total Protein: 7.3 g/dL (ref 6.0–8.3)

## 2023-02-27 LAB — HEMOGLOBIN A1C: Hgb A1c MFr Bld: 5.9 % (ref 4.6–6.5)

## 2023-02-27 LAB — LIPID PANEL
Cholesterol: 118 mg/dL (ref 0–200)
HDL: 14.1 mg/dL — ABNORMAL LOW (ref >39.00)
LDL Cholesterol: 31 mg/dL (ref 0–99)
NonHDL: 104.23
Total CHOL/HDL Ratio: 8
Triglycerides: 365 mg/dL — ABNORMAL HIGH (ref 0.0–149.0)
VLDL: 73 mg/dL — ABNORMAL HIGH (ref 0.0–40.0)

## 2023-02-27 LAB — TSH: TSH: 1.78 u[IU]/mL (ref 0.35–5.50)

## 2023-02-27 LAB — SEDIMENTATION RATE: Sed Rate: 45 mm/h — ABNORMAL HIGH (ref 0–30)

## 2023-02-27 NOTE — Assessment & Plan Note (Signed)
Chronic.  Saw sports med once.  No f/u.  ESR was elevated.  Not sure if from that or other.  Willl reck

## 2023-02-27 NOTE — Assessment & Plan Note (Signed)
Etiol unclear.  Thought may be from polyarth and pt saw sports med once but no f/u.  Will order again but may be skewed d/t illness(or illness may be manifesting.

## 2023-02-27 NOTE — Assessment & Plan Note (Signed)
Chronic.  Meds in past.  Check level

## 2023-02-27 NOTE — Progress Notes (Signed)
Subjective:     Patient ID: Natasha Murphy, female    DOB: 1970/08/29, 52 y.o.   MRN: [id number]  Chief Complaint  Patient presents with   Medical Management of Chronic Issues    6 month follow-up Fasting Had food poisoning since last week  HPI Sweats and Fever - Complains of fever and sweats since Friday. Is waking up in a pool of sweat. States her symptoms started after eating a new type of food.on 02/22/23.  102.9 fever recorded. Denies cough, runny nose, congestion, SOB, constipation, dysuria or other symptoms. Endorses looser stools about once/day and occ sharp abdominal tightness-upper. One bowel movement a day, endorses a strong odor that is not within her normal. Tested negative for Covid on Monday 11/18. Taking 500 mg Tylenol PRN for relief. No tylenol today and so far, no fever.  Moods - Has not taken sertraline 50 mg or trazodone 50 mg in several months. But "probably should"  Pre DM - Has been out of work since September and reports increased stress. States her work situation, watching her granddaughter, and her daughter's post partum depression has prevented her from working out and following a healthy diet.   Sexual Discomfort- Complains of discomfort and light bleeding following intercourse with husband once. Cannot remember her last pap smear.   There are no preventive care reminders to display for this patient.   Past Medical History:  Diagnosis Date   Depression 2015   GERD (gastroesophageal reflux disease) 2020   Heart murmur 1995   HSV-2 seropositive    Hx of chlamydia infection 1995, 01/2009   Hx of gonorrhea 1995   Lactose intolerance    Varicose veins    Vitamin D deficiency     Past Surgical History:  Procedure Laterality Date   CESAREAN SECTION WITH BILATERAL TUBAL LIGATION     UMBILICAL HERNIA REPAIR  age 70     Current Outpatient Medications:    acetaminophen (TYLENOL) 500 MG tablet, Take 500-1,000 mg by mouth every 6 (six) hours as needed for  moderate pain., Disp: , Rfl:    Ascorbic Acid (VITAMIN C) 500 MG CAPS, See admin instructions., Disp: , Rfl:    cetirizine (ZYRTEC ALLERGY) 10 MG tablet, Take 10 mg by mouth daily as needed for allergies. , Disp: , Rfl:    Cholecalciferol (VITAMIN D) 2000 units CAPS, Take 1 capsule by mouth daily., Disp: , Rfl:    ibuprofen (ADVIL) 600 MG tablet, ibuprofen 600 mg tablet  TAKE 1 TABLET BY MOUTH EVERY 6 TO 8 HOURS AS NEEDED, Disp: , Rfl:    Multiple Vitamin (MULTIVITAMIN ADULT PO), Take by mouth., Disp: , Rfl:    sertraline (ZOLOFT) 50 MG tablet, Take 1 tablet by mouth daily., Disp: , Rfl:    traZODone (DESYREL) 50 MG tablet, trazodone 50 mg tablet  TAKE 1 TABLET BY MOUTH AT BEDTIME, Disp: , Rfl:   Allergies  Allergen Reactions   Tramadol Palpitations and Shortness Of Breath    Other reaction(s): tachycardia   Penicillin V Other (See Comments)   Penicillins Hives and Rash    Other reaction(s): rash   ROS neg/noncontributory except as noted HPI/below      Objective:     BP 117/76   Pulse 92   Temp 98 F (36.7 C) (Temporal)   Resp 18   Ht 5\' 7"  (1.702 m)   Wt 224 lb 4 oz (101.7 kg)   LMP 07/12/2021 (Approximate)   SpO2 97%   BMI 35.12  kg/m  Wt Readings from Last 3 Encounters:  02/27/23 224 lb 4 oz (101.7 kg)  01/25/23 219 lb 12.8 oz (99.7 kg)  12/13/22 223 lb (101.2 kg)    Physical Exam   Gen: WDWN NAD HEENT: NCAT, conjunctiva not injected, sclera nonicteric NECK:  supple, no thyromegaly, no nodes, no carotid bruits CARDIAC: RRR, S1S2+, no murmur. DP 2+B LUNGS: CTAB. No wheezes ABDOMEN:  BS+, soft, NTND, No HSM, no masses +diffusely tender -mild EXT:  no edema MSK: no gross abnormalities.  NEURO: A&O x3.  CN II-XII intact.  PSYCH: normal mood. Good eye contact  Results for orders placed or performed in visit on 02/27/23  POCT Urinalysis Dipstick (Automated)  Result Value Ref Range   Color, UA DARK YELLOW    Clarity, UA CLEAR    Glucose, UA Negative Negative    Bilirubin, UA NEG    Ketones, UA NEG    Spec Grav, UA <=1.005 (A) 1.010 - 1.025   Blood, UA NEG    pH, UA 6.0 5.0 - 8.0   Protein, UA Negative Negative   Urobilinogen, UA 1.0 0.2 or 1.0 E.U./dL   Nitrite, UA NEG    Leukocytes, UA Negative Negative    40 min w/pt addressing new/old things, labs, etc.     Assessment & Plan:  Pre-diabetes Assessment & Plan:  Chronic.  Working some on diet, inconsistent on exercise.    Orders: -     Comprehensive metabolic panel -     Hemoglobin A1c  Vitamin D deficiency Assessment & Plan: Chronic.  Meds in past.  Check level  Orders: -     VITAMIN D 25 Hydroxy (Vit-D Deficiency, Fractures)  Chronic pain of both knees Assessment & Plan: Chronic.  Saw sports med once.  No f/u.  ESR was elevated.  Not sure if from that or other.  Willl reck  Orders: -     Rheumatoid factor -     Sedimentation rate -     ANA w/Reflex if Positive -     C-reactive protein  Elevated sed rate Assessment & Plan: Etiol unclear.  Thought may be from polyarth and pt saw sports med once but no f/u.  Will order again but may be skewed d/t illness(or illness may be manifesting.    Orders: -     Rheumatoid factor -     Sedimentation rate -     ANA w/Reflex if Positive -     C-reactive protein  Mixed hyperlipidemia Assessment & Plan: Chronic.  Pt not working on diet/exercise.  encouraged  Orders: -     Comprehensive metabolic panel -     Lipid panel -     TSH  Fever, unspecified fever cause -     CBC with Differential/Platelet -     Comprehensive metabolic panel -     QuantiFERON-TB Gold Plus -     POCT Urinalysis Dipstick (Automated)  Fever-etiol unclear.  No other real symptoms.  ?autoimmune, ?GI, ? Viral, other.  Worse, to ER.  Await labs.  Not impressed w/degree of abd discomfort.   Return in about 2 weeks (around 03/13/2023) for pap and f/u.  Germaine Pomfret Rice,acting as a scribe for Angelena Sole, MD.,have documented all relevant documentation on the  behalf of Angelena Sole, MD,as directed by  Angelena Sole, MD while in the presence of Angelena Sole, MD.  I, Angelena Sole, MD, have reviewed all documentation for this visit. The documentation on 02/27/23  for the exam, diagnosis, procedures, and orders are all accurate and complete.   Angelena Sole, MD

## 2023-02-27 NOTE — Assessment & Plan Note (Signed)
Chronic.  Pt not working on diet/exercise.  encouraged

## 2023-02-27 NOTE — Assessment & Plan Note (Signed)
Chronic.  Working some on diet, inconsistent on exercise.

## 2023-02-27 NOTE — Progress Notes (Signed)
1.  Please see if we can add acute hepatitis panel and mono to the labs. 2.  Liver tests are really elevated.  Presuming she does not drink a lot of alcohol, this could to be viral or other.  Since she was having some pain in the belly, please get stat ultrasound of the liver/gallbladder repeat CMP in 1 week.  If symptoms are getting worse, eyes are turning yellow, go to emergency room 3.  Cholesterol-HDL is very low, triglycerides are high-really needs to work on diet/exercise 4.A1C(3 month average of sugars) is elevated.  This is considered PreDiabetes.  Work on diet-decrease sugars and starches and aim for 30 minutes of exercise 5 days/week to prevent progression to diabetes  5.  Sed rate elevated-we will discuss all the labs in more detail at appointment in December.

## 2023-02-27 NOTE — Patient Instructions (Signed)

## 2023-02-28 ENCOUNTER — Ambulatory Visit (HOSPITAL_BASED_OUTPATIENT_CLINIC_OR_DEPARTMENT_OTHER)
Admission: RE | Admit: 2023-02-28 | Discharge: 2023-02-28 | Disposition: A | Discharge status: Home / Self Care | Payer: BC Managed Care – PPO | Source: Ambulatory Visit | Attending: Family Medicine | Admitting: Family Medicine

## 2023-02-28 ENCOUNTER — Other Ambulatory Visit: Payer: Self-pay | Admitting: *Deleted

## 2023-02-28 ENCOUNTER — Ambulatory Visit (HOSPITAL_BASED_OUTPATIENT_CLINIC_OR_DEPARTMENT_OTHER): Admission: RE | Admit: 2023-02-28 | Payer: BC Managed Care – PPO | Source: Ambulatory Visit

## 2023-02-28 DIAGNOSIS — R7989 Other specified abnormal findings of blood chemistry: Secondary | ICD-10-CM | POA: Insufficient documentation

## 2023-02-28 DIAGNOSIS — R1011 Right upper quadrant pain: Secondary | ICD-10-CM | POA: Insufficient documentation

## 2023-02-28 LAB — ANA W/REFLEX IF POSITIVE: Anti Nuclear Antibody (ANA): NEGATIVE

## 2023-02-28 NOTE — Progress Notes (Signed)
Rheumatoid and autoimmune negative

## 2023-03-01 ENCOUNTER — Ambulatory Visit (INDEPENDENT_AMBULATORY_CARE_PROVIDER_SITE_OTHER): Payer: BC Managed Care – PPO | Admitting: Sports Medicine

## 2023-03-01 VITALS — BP 126/82 | HR 84 | Ht 67.0 in | Wt 222.0 lb

## 2023-03-01 DIAGNOSIS — M25561 Pain in right knee: Secondary | ICD-10-CM

## 2023-03-01 DIAGNOSIS — G8929 Other chronic pain: Secondary | ICD-10-CM | POA: Diagnosis not present

## 2023-03-01 DIAGNOSIS — M25562 Pain in left knee: Secondary | ICD-10-CM

## 2023-03-01 MED ORDER — MELOXICAM 15 MG PO TABS: 15.0000 mg | ORAL_TABLET | Freq: Every day | ORAL | 0 refills | Status: DC

## 2023-03-01 NOTE — Progress Notes (Signed)
Natasha Murphy D.[facility] [address] [place] [address] Phone: [phone]   Assessment and Plan:     1. Bilateral chronic knee pain -Chronic with exacerbation, initial sports medicine visit - Years of bilateral knee pain, typically worse in left, with intermittent flares - Most consistent with flares of mild degenerative changes primarily patellofemoral compartment based on HPI, physical exam - Start meloxicam 15 mg daily x2 weeks.  If still having pain after 2 weeks, complete 3rd-week of NSAID. May use remaining NSAID as needed once daily for pain control.  Do not to use additional over-the-counter NSAIDs (ibuprofen, naproxen, Advil, Aleve) while taking prescription NSAIDs.  May use Tylenol [phone] mg 2 to 3 times a day for breakthrough pain. - Start HEP for knees and patellofemoral  15 additional minutes spent for educating Therapeutic Home Exercise Program.  This included exercises focusing on stretching, strengthening, with focus on eccentric aspects.   Long term goals include an improvement in range of motion, strength, endurance as well as avoiding reinjury. Patient's frequency would include in 1-2 times a day, 3-5 times a week for a duration of 6-12 weeks. Proper technique shown and discussed handout in great detail with ATC.  All questions were discussed and answered.     Pertinent previous records reviewed include bilateral knee x-ray 09/11/2016  Follow Up: 4 weeks for reevaluation.  If no improvement or worsening of symptoms, would obtain bilateral knee x-rays, could consider physical therapy versus CSI   Subjective:   I, Natasha Murphy, am serving as a Neurosurgeon for Doctor Natasha Murphy  Chief Complaint: knee pain   HPI:   03/01/23 Patient is a 52 year old female with concerns of knee pain. Patient states bilateral knee pain but left is worse. She endorses stiffness and locking up. Pain for about a year. Sits a lot at work and  when she gets up she has to walk or work out the pain. No meds for the pain. Patellar tendon pain . Nom numbness or tingling.    Relevant Historical Information: None pertinent  Additional pertinent review of systems negative.   Current Outpatient Medications:    acetaminophen (TYLENOL) 500 MG tablet, Take 500-1,000 mg by mouth every 6 (six) hours as needed for moderate pain., Disp: , Rfl:    Ascorbic Acid (VITAMIN C) 500 MG CAPS, See admin instructions., Disp: , Rfl:    cetirizine (ZYRTEC ALLERGY) 10 MG tablet, Take 10 mg by mouth daily as needed for allergies. , Disp: , Rfl:    Cholecalciferol (VITAMIN D) 2000 units CAPS, Take 1 capsule by mouth daily., Disp: , Rfl:    ibuprofen (ADVIL) 600 MG tablet, ibuprofen 600 mg tablet  TAKE 1 TABLET BY MOUTH EVERY 6 TO 8 HOURS AS NEEDED, Disp: , Rfl:    meloxicam (MOBIC) 15 MG tablet, Take 1 tablet (15 mg total) by mouth daily., Disp: 30 tablet, Rfl: 0   Multiple Vitamin (MULTIVITAMIN ADULT PO), Take by mouth., Disp: , Rfl:    sertraline (ZOLOFT) 50 MG tablet, Take 1 tablet by mouth daily., Disp: , Rfl:    traZODone (DESYREL) 50 MG tablet, trazodone 50 mg tablet  TAKE 1 TABLET BY MOUTH AT BEDTIME, Disp: , Rfl:    Objective:     Vitals:   03/01/23 0926  BP: 126/82  Pulse: 84  SpO2: 100%  Weight: 222 lb (100.7 kg)  Height: 5\' 7"  (1.702 m)      Body mass index is 34.77 kg/m.  Physical Exam:    General:  awake, alert oriented, no acute distress nontoxic Skin: no suspicious lesions or rashes Neuro:sensation intact and strength 5/5 with no deficits, no atrophy, normal muscle tone Psych: No signs of anxiety, depression or other mood disorder  Bilateral knee: Crepitus present bilaterally, worse on right No swelling No deformity Neg fluid wave, joint milking ROM Flex 110, Ext 0 NTTP over the quad tendon, medial fem condyle, lat fem condyle, patella, plica, patella tendon, tibial tuberostiy, fibular head, posterior fossa, pes anserine  bursa, gerdy's tubercle, medial jt line, lateral jt line Neg anterior and posterior drawer Neg lachman Neg sag sign Negative varus stress Negative valgus stress Negative McMurray Negative Thessaly left, positive right  Gait normal    Electronically signed by:  Natasha Murphy D.[facility] 9:47 AM 03/01/23

## 2023-03-01 NOTE — Patient Instructions (Addendum)
Knee HEP - Start meloxicam 15 mg daily x2 weeks.  If still having pain after 2 weeks, complete 3rd-week of NSAID. May use remaining NSAID as needed once daily for pain control.  Do not to use additional over-the-counter NSAIDs (ibuprofen, naproxen, Advil, Aleve) while taking prescription NSAIDs.  May use Tylenol [phone] mg 2 to 3 times a day for breakthrough pain. 4 week follow up

## 2023-03-02 LAB — QUANTIFERON-TB GOLD PLUS
Mitogen-NIL: >10 [IU]/mL
NIL: 0.28 [IU]/mL
QuantiFERON-TB Gold Plus: NEGATIVE
TB1-NIL: 0.04 [IU]/mL
TB2-NIL: 0.07 [IU]/mL

## 2023-03-02 LAB — RHEUMATOID FACTOR: Rheumatoid fact SerPl-aCnc: 10 [IU]/mL (ref <14)

## 2023-03-06 ENCOUNTER — Other Ambulatory Visit: Payer: BC Managed Care – PPO

## 2023-03-13 ENCOUNTER — Other Ambulatory Visit (HOSPITAL_COMMUNITY)
Admission: RE | Admit: 2023-03-13 | Discharge: 2023-03-13 | Disposition: A | Discharge status: Home / Self Care | Payer: BC Managed Care – PPO | Source: Ambulatory Visit | Attending: Family Medicine | Admitting: Family Medicine

## 2023-03-13 ENCOUNTER — Ambulatory Visit (INDEPENDENT_AMBULATORY_CARE_PROVIDER_SITE_OTHER): Payer: BC Managed Care – PPO | Admitting: Family Medicine

## 2023-03-13 ENCOUNTER — Other Ambulatory Visit: Payer: Self-pay | Admitting: *Deleted

## 2023-03-13 ENCOUNTER — Encounter: Payer: Self-pay | Admitting: Family Medicine

## 2023-03-13 ENCOUNTER — Other Ambulatory Visit: Payer: Self-pay

## 2023-03-13 VITALS — BP 116/78 | HR 88 | Temp 97.7°F | Resp 18 | Ht 67.0 in | Wt 222.2 lb

## 2023-03-13 DIAGNOSIS — Z01419 Encounter for gynecological examination (general) (routine) without abnormal findings: Secondary | ICD-10-CM | POA: Diagnosis present

## 2023-03-13 DIAGNOSIS — R7989 Other specified abnormal findings of blood chemistry: Secondary | ICD-10-CM

## 2023-03-13 DIAGNOSIS — Z124 Encounter for screening for malignant neoplasm of cervix: Secondary | ICD-10-CM | POA: Diagnosis present

## 2023-03-13 DIAGNOSIS — R1011 Right upper quadrant pain: Secondary | ICD-10-CM

## 2023-03-13 LAB — COMPREHENSIVE METABOLIC PANEL
ALT: 36 U/L — ABNORMAL HIGH (ref 0–35)
AST: 29 U/L (ref 0–37)
Albumin: 4.1 g/dL (ref 3.5–5.2)
Alkaline Phosphatase: 88 U/L (ref 39–117)
BUN: 18 mg/dL (ref 6–23)
CO2: 29 meq/L (ref 19–32)
Calcium: 9.6 mg/dL (ref 8.4–10.5)
Chloride: 105 meq/L (ref 96–112)
Creatinine, Ser: 0.83 mg/dL (ref 0.40–1.20)
GFR: 81.17 mL/min (ref >60.00)
Glucose, Bld: 75 mg/dL (ref 70–99)
Potassium: 3.8 meq/L (ref 3.5–5.1)
Sodium: 140 meq/L (ref 135–145)
Total Bilirubin: 0.5 mg/dL (ref 0.2–1.2)
Total Protein: 7.8 g/dL (ref 6.0–8.3)

## 2023-03-13 LAB — RESULTS CONSOLE HPV: CHL HPV: NEGATIVE

## 2023-03-13 LAB — HM PAP SMEAR

## 2023-03-13 NOTE — Patient Instructions (Signed)

## 2023-03-13 NOTE — Progress Notes (Signed)
Phone [phone]   Subjective:   Patient is a 52 y.o. female presenting for annual physical.    Chief Complaint  Patient presents with   Medical Management of Chronic Issues    Follow-up   Gynecologic Exam   Female exam.  Trying to add exercise. No menses in 1 yr.  +hot flashes-more at night. G3P3.  1 abn pap long past and repeat ok.  Some vag dryness.   See problem oriented charting- ROS- ROS: Gen: no fever, chills  Skin: no rash, itching ENT: no ear pain, ear drainage, nasal congestion, rhinorrhea, sinus pressure, sore throat Eyes: no blurry vision, double vision Resp: no cough, wheeze,SOB CV: no CP, palpitations, LE edema,  GI: no heartburn, n/v/d/c, abd pain GU: no dysuria, urgency, frequency, hematuria MSK: no joint pain, myalgias, back pain Neuro: no dizziness, headache, weakness, vertigo Psych: getting better.   The following were reviewed and entered/updated in epic: Past Medical History:  Diagnosis Date   Depression 2015   GERD (gastroesophageal reflux disease) 2020   Heart murmur 1995   HSV-2 seropositive    Hx of chlamydia infection 1995, 01/2009   Hx of gonorrhea 1995   Lactose intolerance    Varicose veins    Vitamin D deficiency    Patient Active Problem List   Diagnosis Date Noted   Mixed hyperlipidemia 02/27/2023   Elevated sed rate 02/27/2023   Local edema 09/23/2022   Pre-diabetes 06/27/2022   Anterior tibialis tendinitis of left leg 01/16/2017   Osteoarthritis of spine with radiculopathy, cervical region 01/16/2017   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   Osteoarthritis of spine 10/30/2016   Vitamin D deficiency 10/02/2016   Pain in both knees 09/12/2016   Past Surgical History:  Procedure Laterality Date   CESAREAN SECTION WITH BILATERAL TUBAL LIGATION     UMBILICAL HERNIA REPAIR  age 89    Family History  Problem Relation Age of Onset   Hypertension Mother    Depression Mother    Anxiety disorder Mother    Cancer  Paternal Aunt    Cancer Paternal Grandmother    Breast cancer Neg Hx     Medications- reviewed and updated Current Outpatient Medications  Medication Sig Dispense Refill   acetaminophen (TYLENOL) 500 MG tablet Take 500-1,000 mg by mouth every 6 (six) hours as needed for moderate pain.     Ascorbic Acid (VITAMIN C) 500 MG CAPS See admin instructions.     cetirizine (ZYRTEC ALLERGY) 10 MG tablet Take 10 mg by mouth daily as needed for allergies.      Cholecalciferol (VITAMIN D) 2000 units CAPS Take 1 capsule by mouth daily.     ibuprofen (ADVIL) 600 MG tablet ibuprofen 600 mg tablet  TAKE 1 TABLET BY MOUTH EVERY 6 TO 8 HOURS AS NEEDED     meloxicam (MOBIC) 15 MG tablet Take 1 tablet (15 mg total) by mouth daily. 30 tablet 0   Multiple Vitamin (MULTIVITAMIN ADULT PO) Take by mouth.     sertraline (ZOLOFT) 50 MG tablet Take 1 tablet by mouth daily.     traZODone (DESYREL) 50 MG tablet trazodone 50 mg tablet  TAKE 1 TABLET BY MOUTH AT BEDTIME     No current facility-administered medications for this visit.    Allergies-reviewed and updated Allergies  Allergen Reactions   Tramadol Palpitations and Shortness Of Breath    Other reaction(s): tachycardia   Penicillin V Other (See Comments)   Penicillins Hives and Rash    Other reaction(s):  rash    Social History   Social History Narrative   Step 3 and 3 of hers.  1 step grand   Objective  Objective:  BP 116/78   Pulse 88   Temp 97.7 F (36.5 C) (Temporal)   Resp 18   Ht 5\' 7"  (1.702 m)   Wt 222 lb 4 oz (100.8 kg)   LMP 07/12/2021 (Approximate)   SpO2 97%   BMI 34.81 kg/m  Physical Exam  Gen: WDWN NAD HEENT: NCAT, conjunctiva not injected, sclera nonicteric TM WNL B, OP moist, no exudates  NECK:  supple, no thyromegaly, no nodes, no carotid bruits CARDIAC: RRR, S1S2+, no murmur. DP 2+B LUNGS: CTAB. No wheezes ABDOMEN:  BS+, soft, NTND, No HSM, no masses EXT:  no edema MSK: no gross abnormalities. MS 5/5 all 4 NEURO:  A&O x3.  CN II-XII intact.  PSYCH: normal mood. Good eye contact   Breasts: Examined lying and sitting.              Right:   Without masses, retractions,  nipple discharge or axillary adenopathy.               Left:     Without masses, retractions, nipple discharge or axillary adenopathy. Genitourinary              Inguinal/mons:  Normal without inguinal adenopathy             External genitalia:  Normal appearing vulva with no masses, tenderness, or lesions             BUS/Urethra/Skene's glands:  Normal             Vagina:  Normal appearing with normal color and discharge, no lesions             Cervix:  Normal appearing without discharge .  + bled easily.  At 12:00 sl irrit             Uterus:  Normal in size, shape and contour.  Midline and mobile, nontender             Adnexa/parametria:                           Rt:        Normal in size, without masses or tenderness.                         Lt:        Normal in size, without masses or tenderness.             Anus and perineum: Normal  Chaperone present QJ Labs discussed   Assessment and Plan   Health Maintenance counseling: 1. Anticipatory guidance: Patient counseled regarding regular dental exams q6 months, eye exams,  avoiding smoking and second hand smoke, limiting alcohol to 1 beverage per day, no illicit drugs.   2. Risk factor reduction:  Advised patient of need for regular exercise and diet rich and fruits and vegetables to reduce risk of heart attack and stroke. Exercise- encouraged.  Wt Readings from Last 3 Encounters:  03/13/23 222 lb 4 oz (100.8 kg)  03/01/23 222 lb (100.7 kg)  02/27/23 224 lb 4 oz (101.7 kg)   3. Immunizations/screenings/ancillary studies Immunization History  Administered Date(s) Administered   Influenza-Unspecified 11/23/2016, 01/29/2019   PFIZER(Purple Top)SARS-COV-2 Vaccination 03/10/2020, 04/05/2020   Tdap 11/28/2017   There are no preventive care  reminders to display for this patient.   4. Cervical cancer screening- today 5. Breast cancer screening-  mammogram 09/21/22 6. Colon cancer screening - due 08/05/24 7. Skin cancer screening- advised regular sunscreen use. Denies worrisome, changing, or new skin lesions.  8. Birth control/STD check- TL 9. Osteoporosis screening- n/a 10. Smoking associated screening - non smoker  Well woman exam with routine gynecological exam -     Cytology - PAP  Screening for cervical cancer -     Cytology - PAP   Well woman-pap and pt req STI testing.  Elevated lft's-repeat.   Pt chol very unusual for her.  Not fasting so will wait for reck.  ?lab error.  Recommended follow up: Return for as sch in june.  Lab/Order associations:non fasting.  Cheesecake and pie 1 hr ago.  Angelena Sole, MD

## 2023-03-13 NOTE — Progress Notes (Signed)
Better.  Hepatitis panel still pending.   I would say, return fasting in 1 month to repeat cholesterol

## 2023-03-14 LAB — HEPATITIS PANEL, ACUTE
Hep A IgM: NONREACTIVE
Hep B C IgM: NONREACTIVE
Hepatitis B Surface Ag: NONREACTIVE
Hepatitis C Ab: NONREACTIVE

## 2023-03-14 NOTE — Progress Notes (Signed)
No hepatitis.  Please place future order for cmp and lipids(elevated lft, hyperlipidemia) and sch lab visit for 1 month

## 2023-03-15 ENCOUNTER — Encounter: Payer: Self-pay | Admitting: Family Medicine

## 2023-03-15 ENCOUNTER — Other Ambulatory Visit: Payer: Self-pay | Admitting: *Deleted

## 2023-03-15 DIAGNOSIS — R7989 Other specified abnormal findings of blood chemistry: Secondary | ICD-10-CM

## 2023-03-15 DIAGNOSIS — E785 Hyperlipidemia, unspecified: Secondary | ICD-10-CM

## 2023-03-15 LAB — CYTOLOGY - PAP
Chlamydia: NEGATIVE
Comment: NEGATIVE
Comment: NEGATIVE
Comment: NEGATIVE
Comment: NORMAL
Diagnosis: NEGATIVE
High risk HPV: NEGATIVE
Neisseria Gonorrhea: NEGATIVE
Trichomonas: NEGATIVE

## 2023-03-28 NOTE — Progress Notes (Deleted)
    [facility] [address] [place] [address] Phone: [phone]   Assessment and Plan:     There are no diagnoses linked to this encounter.  ***   Pertinent previous records reviewed include ***    Follow Up: ***     Subjective:   I, Natasha Murphy, am serving as a Neurosurgeon for Natasha Murphy   Chief Complaint: knee pain    HPI:    03/01/23 Patient is a 52 year old female with concerns of knee pain. Patient states bilateral knee pain but left is worse. She endorses stiffness and locking up. Pain for about a year. Sits a lot at work and when she gets up she has to walk or work out the pain. No meds for the pain. Patellar tendon pain . Nom numbness or tingling.    03/29/2023 Patient states   Relevant Historical Information: None pertinent Additional pertinent review of systems negative.   Current Outpatient Medications:    acetaminophen (TYLENOL) 500 MG tablet, Take 500-1,000 mg by mouth every 6 (six) hours as needed for moderate pain., Disp: , Rfl:    Ascorbic Acid (VITAMIN C) 500 MG CAPS, See admin instructions., Disp: , Rfl:    cetirizine (ZYRTEC ALLERGY) 10 MG tablet, Take 10 mg by mouth daily as needed for allergies. , Disp: , Rfl:    Cholecalciferol (VITAMIN D) 2000 units CAPS, Take 1 capsule by mouth daily., Disp: , Rfl:    ibuprofen (ADVIL) 600 MG tablet, ibuprofen 600 mg tablet  TAKE 1 TABLET BY MOUTH EVERY 6 TO 8 HOURS AS NEEDED, Disp: , Rfl:    meloxicam (MOBIC) 15 MG tablet, Take 1 tablet (15 mg total) by mouth daily., Disp: 30 tablet, Rfl: 0   Multiple Vitamin (MULTIVITAMIN ADULT PO), Take by mouth., Disp: , Rfl:    sertraline (ZOLOFT) 50 MG tablet, Take 1 tablet by mouth daily., Disp: , Rfl:    traZODone (DESYREL) 50 MG tablet, trazodone 50 mg tablet  TAKE 1 TABLET BY MOUTH AT BEDTIME, Disp: , Rfl:    Objective:     There were no vitals filed for this visit.    There is no height or weight on  file to calculate BMI.    Physical Exam:    ***   Electronically signed by:  [facility] 7:41 AM 03/28/23

## 2023-03-29 ENCOUNTER — Ambulatory Visit: Payer: BC Managed Care – PPO | Admitting: Sports Medicine

## 2023-03-30 ENCOUNTER — Other Ambulatory Visit: Payer: Self-pay | Admitting: Sports Medicine

## 2023-04-19 ENCOUNTER — Other Ambulatory Visit: Payer: BC Managed Care – PPO

## 2023-05-09 ENCOUNTER — Ambulatory Visit: Payer: Self-pay | Admitting: Family Medicine

## 2023-05-09 ENCOUNTER — Telehealth: Payer: Self-pay | Admitting: Family Medicine

## 2023-05-09 NOTE — Telephone Encounter (Signed)
3rd attempt to call patient back, no answer, left voicemail, forwarded to clinic

## 2023-05-09 NOTE — Telephone Encounter (Signed)
2nd attempt to call patient back, no answer, left voicemail:

## 2023-05-09 NOTE — Telephone Encounter (Signed)
Patient advised that she can take Tylenol Cold and Sinus and to keep appointment for tomorrow.

## 2023-05-09 NOTE — Telephone Encounter (Signed)
Copied from CRM [id number]. Topic: Clinical - Pink Word Triage >> May 09, 2023 11:29 AM Tiffany H wrote: Patient called to schedule an acute visit for a lingering sinus infection. Patient has been scheduled for 05/10/23 appointment with Jarold Motto.   Patient advised that she's had a sinus infection for a few weeks. Both grandchildren, toddler and infant, have had RSV recently. Patient advised that the congestion isn't painful but patient has concern due to exposure.   Please follow up with patient to provide guidance as to how she can treat her symptoms until tomorrow's appointment.

## 2023-05-09 NOTE — Telephone Encounter (Signed)
Called pt back no answer; left voice mail.

## 2023-05-09 NOTE — Telephone Encounter (Unsigned)
Copied from CRM [id number]. Topic: General - Call Back - No Documentation >> May 09, 2023  1:55 PM Samuel Jester B wrote: Reason for CRM: Pt received a call from Marga Melnick and would like for her to give her a callback the regarding some follow up questions.

## 2023-05-09 NOTE — Telephone Encounter (Signed)
Patient called and advised per pcp what she could take until appointment on tomorrow.

## 2023-05-09 NOTE — Telephone Encounter (Signed)
Left message for patient to return call to advise her that per pcp, she can take Tylenol Cold and Flu, Mucinex DM, anything OTC for her sx.

## 2023-05-10 ENCOUNTER — Ambulatory Visit (INDEPENDENT_AMBULATORY_CARE_PROVIDER_SITE_OTHER): Payer: BC Managed Care – PPO | Admitting: Physician Assistant

## 2023-05-10 ENCOUNTER — Encounter: Payer: Self-pay | Admitting: Physician Assistant

## 2023-05-10 VITALS — BP 122/80 | HR 75 | Temp 97.4°F | Ht 67.0 in | Wt 211.2 lb

## 2023-05-10 DIAGNOSIS — R0981 Nasal congestion: Secondary | ICD-10-CM | POA: Diagnosis not present

## 2023-05-10 MED ORDER — DOXYCYCLINE HYCLATE 100 MG PO TABS: 100.0000 mg | ORAL_TABLET | Freq: Two times a day (BID) | ORAL | 0 refills | Status: DC

## 2023-05-10 NOTE — Progress Notes (Signed)
Natasha Murphy is a 53 y.o. female here for a new problem.  History of Present Illness:   Chief Complaint  Patient presents with   Sinus Problem    Pt c/o sinus pressure and congestion, nasal drainage light yellow, x 2 weeks.    HPI  Sinus congestion: Pt complains of sinus congestion and pressure, nasal drainage (light yellow) starting 2 weeks ago.  Has hx of sinus infections and is taking Zyrtec daily. Took Mucinex but states it worsened her symptoms.  She then tried Tylenol cold and flu which helped more than Mucinex.  Has been blowing her nose often and irritated her nose raw on the outside..  Is applying cocoa butter on her nose to help with this irritation.  Reports known exposure of sickness from her grandchildren. Denies any fever or cough.   Past Medical History:  Diagnosis Date   Depression 2015   GERD (gastroesophageal reflux disease) 2020   Heart murmur 1995   HSV-2 seropositive    Hx of chlamydia infection 1995, 01/2009   Hx of gonorrhea 1995   Lactose intolerance    Varicose veins    Vitamin D deficiency      Social History   Tobacco Use   Smoking status: Never   Smokeless tobacco: Never  Vaping Use   Vaping status: Never Used  Substance Use Topics   Alcohol use: Yes    Comment: occassional (twice/month)   Drug use: No    Past Surgical History:  Procedure Laterality Date   CESAREAN SECTION WITH BILATERAL TUBAL LIGATION     UMBILICAL HERNIA REPAIR  age 66    Family History  Problem Relation Age of Onset   Hypertension Mother    Depression Mother    Anxiety disorder Mother    Cancer Paternal Aunt    Cancer Paternal Grandmother    Breast cancer Neg Hx     Allergies  Allergen Reactions   Tramadol Palpitations and Shortness Of Breath    Other reaction(s): tachycardia   Penicillin V Other (See Comments)   Penicillins Hives and Rash    Other reaction(s): rash    Current Medications:   Current Outpatient Medications:    acetaminophen  (TYLENOL) 500 MG tablet, Take 500-1,000 mg by mouth every 6 (six) hours as needed for moderate pain., Disp: , Rfl:    Ascorbic Acid (VITAMIN C) 500 MG CAPS, See admin instructions., Disp: , Rfl:    cetirizine (ZYRTEC ALLERGY) 10 MG tablet, Take 10 mg by mouth daily as needed for allergies. , Disp: , Rfl:    Cholecalciferol (VITAMIN D) 2000 units CAPS, Take 1 capsule by mouth daily., Disp: , Rfl:    doxycycline (VIBRA-TABS) 100 MG tablet, Take 1 tablet (100 mg total) by mouth 2 (two) times daily., Disp: 14 tablet, Rfl: 0   ibuprofen (ADVIL) 600 MG tablet, ibuprofen 600 mg tablet  TAKE 1 TABLET BY MOUTH EVERY 6 TO 8 HOURS AS NEEDED, Disp: , Rfl:    meloxicam (MOBIC) 15 MG tablet, Take 1 tablet (15 mg total) by mouth daily., Disp: 30 tablet, Rfl: 0   Multiple Vitamin (MULTIVITAMIN ADULT PO), Take by mouth., Disp: , Rfl:    sertraline (ZOLOFT) 50 MG tablet, Take 1 tablet by mouth daily., Disp: , Rfl:    traZODone (DESYREL) 50 MG tablet, trazodone 50 mg tablet  TAKE 1 TABLET BY MOUTH AT BEDTIME, Disp: , Rfl:    Review of Systems:   Negative unless otherwise specified per HPI.  Vitals:  Vitals:   05/10/23 1023  BP: 122/80  Pulse: 75  Temp: (!) 97.4 F (36.3 C)  TempSrc: Temporal  SpO2: 98%  Weight: 211 lb 4 oz (95.8 kg)  Height: 5\' 7"  (1.702 m)     Body mass index is 33.09 kg/m.  Physical Exam:   Physical Exam Vitals and nursing note reviewed.  Constitutional:      General: She is not in acute distress.    Appearance: She is well-developed. She is not ill-appearing or toxic-appearing.  HENT:     Head: Normocephalic and atraumatic.     Right Ear: Tympanic membrane, ear canal and external ear normal. Tympanic membrane is not erythematous, retracted or bulging.     Left Ear: Tympanic membrane, ear canal and external ear normal. Tympanic membrane is not erythematous, retracted or bulging.     Nose:     Right Sinus: Frontal sinus tenderness present. No maxillary sinus tenderness.      Left Sinus: Frontal sinus tenderness present. No maxillary sinus tenderness.     Mouth/Throat:     Pharynx: Uvula midline. No posterior oropharyngeal erythema.  Eyes:     General: Lids are normal.     Conjunctiva/sclera: Conjunctivae normal.  Neck:     Trachea: Trachea normal.  Cardiovascular:     Rate and Rhythm: Normal rate and regular rhythm.     Pulses: Normal pulses.     Heart sounds: Normal heart sounds, S1 normal and S2 normal.  Pulmonary:     Effort: Pulmonary effort is normal.     Breath sounds: Normal breath sounds. No decreased breath sounds, wheezing, rhonchi or rales.  Lymphadenopathy:     Cervical: No cervical adenopathy.  Skin:    General: Skin is warm and dry.  Neurological:     Mental Status: She is alert.     GCS: GCS eye subscore is 4. GCS verbal subscore is 5. GCS motor subscore is 6.  Psychiatric:        Speech: Speech normal.        Behavior: Behavior normal. Behavior is cooperative.     Assessment and Plan:   1. Sinus congestion (Primary) No red flags on exam.   Will initiate doxycycline per orders.  Discussed taking medications as prescribed.  Reviewed return precautions including new or worsening fever, SOB, new or worsening cough or other concerns.  Push fluids and rest.  I recommend that patient follow-up if symptoms worsen or persist despite treatment x 7-10 days, sooner if needed.   I, Isabelle Course, acting as a Neurosurgeon for Jarold Motto, [place]., have documented all relevant documentation on the behalf of Jarold Motto, [place], as directed by  Jarold Motto, PA while in the presence of Jarold Motto, [place].  I, Jarold Motto, [place], have reviewed all documentation for this visit. The documentation on 05/10/23 for the exam, diagnosis, procedures, and orders are all accurate and complete.   Jarold Motto, PA-C

## 2023-09-27 ENCOUNTER — Encounter: Payer: Self-pay | Admitting: Family Medicine

## 2023-09-27 ENCOUNTER — Ambulatory Visit: Payer: BC Managed Care – PPO | Admitting: Family Medicine

## 2023-09-27 VITALS — BP 130/77 | HR 97 | Temp 97.9°F | Resp 18 | Ht 67.0 in | Wt 220.2 lb

## 2023-09-27 DIAGNOSIS — E559 Vitamin D deficiency, unspecified: Secondary | ICD-10-CM

## 2023-09-27 DIAGNOSIS — Z0000 Encounter for general adult medical examination without abnormal findings: Secondary | ICD-10-CM

## 2023-09-27 LAB — COMPREHENSIVE METABOLIC PANEL WITH GFR
ALT: 14 U/L (ref 0–35)
AST: 16 U/L (ref 0–37)
Albumin: 4.3 g/dL (ref 3.5–5.2)
Alkaline Phosphatase: 79 U/L (ref 39–117)
BUN: 12 mg/dL (ref 6–23)
CO2: 28 meq/L (ref 19–32)
Calcium: 10.1 mg/dL (ref 8.4–10.5)
Chloride: 102 meq/L (ref 96–112)
Creatinine, Ser: 0.74 mg/dL (ref 0.40–1.20)
GFR: 92.8 mL/min (ref >60.00)
Glucose, Bld: 90 mg/dL (ref 70–99)
Potassium: 4 meq/L (ref 3.5–5.1)
Sodium: 139 meq/L (ref 135–145)
Total Bilirubin: 0.8 mg/dL (ref 0.2–1.2)
Total Protein: 7.8 g/dL (ref 6.0–8.3)

## 2023-09-27 LAB — LIPID PANEL
Cholesterol: 221 mg/dL — ABNORMAL HIGH (ref 0–200)
HDL: 42.7 mg/dL (ref >39.00)
LDL Cholesterol: 139 mg/dL — ABNORMAL HIGH (ref 0–99)
NonHDL: 178.29
Total CHOL/HDL Ratio: 5
Triglycerides: 196 mg/dL — ABNORMAL HIGH (ref 0.0–149.0)
VLDL: 39.2 mg/dL (ref 0.0–40.0)

## 2023-09-27 LAB — CBC WITH DIFFERENTIAL/PLATELET
Basophils Absolute: 0.1 10*3/uL (ref 0.0–0.1)
Basophils Relative: 0.4 % (ref 0.0–3.0)
Eosinophils Absolute: 0 10*3/uL (ref 0.0–0.7)
Eosinophils Relative: 0.3 % (ref 0.0–5.0)
HCT: 39.3 % (ref 36.0–46.0)
Hemoglobin: 13.3 g/dL (ref 12.0–15.0)
Lymphocytes Relative: 15.2 % (ref 12.0–46.0)
Lymphs Abs: 2.4 10*3/uL (ref 0.7–4.0)
MCHC: 33.7 g/dL (ref 30.0–36.0)
MCV: 89.5 fl (ref 78.0–100.0)
Monocytes Absolute: 0.4 10*3/uL (ref 0.1–1.0)
Monocytes Relative: 2.4 % — ABNORMAL LOW (ref 3.0–12.0)
Neutro Abs: 12.7 10*3/uL — ABNORMAL HIGH (ref 1.4–7.7)
Neutrophils Relative %: 81.7 % — ABNORMAL HIGH (ref 43.0–77.0)
Platelets: 202 10*3/uL (ref 150.0–400.0)
RBC: 4.4 Mil/uL (ref 3.87–5.11)
RDW: 13 % (ref 11.5–15.5)
WBC: 15.5 10*3/uL — ABNORMAL HIGH (ref 4.0–10.5)

## 2023-09-27 LAB — VITAMIN D 25 HYDROXY (VIT D DEFICIENCY, FRACTURES): VITD: 33.85 ng/mL (ref 30.00–100.00)

## 2023-09-27 LAB — HEMOGLOBIN A1C: Hgb A1c MFr Bld: 5.7 % (ref 4.6–6.5)

## 2023-09-27 LAB — TSH: TSH: 0.72 u[IU]/mL (ref 0.35–5.50)

## 2023-09-27 NOTE — Patient Instructions (Signed)

## 2023-09-27 NOTE — Progress Notes (Unsigned)
 Phone [phone]   Subjective:   Patient is a 53 y.o. female presenting for annual physical.    Chief Complaint  Patient presents with   Annual Exam    CPE Fasting     See problem oriented charting- ROS- ROS: Gen: no fever, chills  Skin: no rash, itching ENT: no ear pain, ear drainage, nasal congestion, rhinorrhea, sinus pressure, sore throat Eyes: no blurry vision, double vision Resp: no cough, wheeze,SOB CV: no CP, palpitations, LE edema,  GI: no heartburn, n/v/d/c, abd pain GU: no dysuria, urgency, frequency, hematuria MSK: Knees Neuro: no dizziness, headache, weakness, vertigo Psych: no depression, anxiety, insomnia, SI   The following were reviewed and entered/updated in epic: Past Medical History:  Diagnosis Date   Depression 2015   GERD (gastroesophageal reflux disease) 2020   Heart murmur 1995   HSV-2 seropositive    Hx of chlamydia infection 1995, 01/2009   Hx of gonorrhea 1995   Lactose intolerance    Varicose veins    Vitamin D  deficiency    Patient Active Problem List   Diagnosis Date Noted   Mixed hyperlipidemia 02/27/2023   Elevated sed rate 02/27/2023   Local edema 09/23/2022   Pre-diabetes 06/27/2022   Anterior tibialis tendinitis of left leg 01/16/2017   Osteoarthritis of spine with radiculopathy, cervical region 01/16/2017   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   Osteoarthritis of spine 10/30/2016   Vitamin D  deficiency 10/02/2016   Pain in both knees 09/12/2016   Past Surgical History:  Procedure Laterality Date   CESAREAN SECTION WITH BILATERAL TUBAL LIGATION     UMBILICAL HERNIA REPAIR  age 89    Family History  Problem Relation Age of Onset   Hypertension Mother    Depression Mother    Anxiety disorder Mother    Cancer Paternal Aunt    Cancer Paternal Grandmother    Breast cancer Neg Hx     Medications- reviewed and updated Current Outpatient Medications  Medication Sig Dispense Refill   acetaminophen  (TYLENOL) 500 MG tablet Take 500-1,000 mg by mouth every 6 (six) hours as needed for moderate pain.     cetirizine (ZYRTEC ALLERGY) 10 MG tablet Take 10 mg by mouth daily as needed for allergies.      ibuprofen (ADVIL) 600 MG tablet ibuprofen 600 mg tablet  TAKE 1 TABLET BY MOUTH EVERY 6 TO 8 HOURS AS NEEDED     Multiple Vitamin (MULTIVITAMIN ADULT PO) Take by mouth.     Ascorbic Acid (VITAMIN C) 500 MG CAPS See admin instructions. (Patient not taking: Reported on 09/27/2023)     Cholecalciferol  (VITAMIN D ) 2000 units CAPS Take 1 capsule by mouth daily. (Patient not taking: Reported on 09/27/2023)     doxycycline  (VIBRA -TABS) 100 MG tablet Take 1 tablet (100 mg total) by mouth 2 (two) times daily. (Patient not taking: Reported on 09/27/2023) 14 tablet 0   meloxicam  (MOBIC ) 15 MG tablet Take 1 tablet (15 mg total) by mouth daily. (Patient not taking: Reported on 09/27/2023) 30 tablet 0   sertraline (ZOLOFT) 50 MG tablet Take 1 tablet by mouth daily. (Patient not taking: Reported on 09/27/2023)     traZODone (DESYREL) 50 MG tablet trazodone 50 mg tablet  TAKE 1 TABLET BY MOUTH AT BEDTIME (Patient not taking: Reported on 09/27/2023)     No current facility-administered medications for this visit.    Allergies-reviewed and updated Allergies  Allergen Reactions   Tramadol Palpitations and Shortness Of Breath    Other reaction(s):  tachycardia   Penicillin V Other (See Comments)   Penicillins Hives and Rash    Other reaction(s): rash    Social History   Social History Narrative   Step 3 and 3 of hers.  1 step grand   Objective  Objective:  BP 130/77   Pulse 97   Temp 97.9 F (36.6 C) (Temporal)   Resp 18   Ht 5' 7 (1.702 m)   Wt 220 lb 4 oz (99.9 kg)   LMP 07/12/2021 (Approximate)   SpO2 98%   BMI 34.50 kg/m  Physical Exam  Gen: WDWN NAD HEENT: NCAT, conjunctiva not injected, sclera nonicteric TM WNL B, OP moist, no exudates  NECK:  supple, no thyromegaly, no nodes, no carotid  bruits CARDIAC: RRR, S1S2+, no murmur. DP 2+B LUNGS: CTAB. No wheezes ABDOMEN:  BS+, soft, NTND, No HSM, no masses EXT:  no edema MSK: no gross abnormalities. MS 5/5 all 4 NEURO: A&O x3.  CN II-XII intact.  PSYCH: normal mood. Good eye contact     Assessment and Plan   Health Maintenance counseling: 1. Anticipatory guidance: Patient counseled regarding regular dental exams q6 months, eye exams,  avoiding smoking and second hand smoke, limiting alcohol to 1 beverage per day, no illicit drugs.   2. Risk factor reduction:  Advised patient of need for regular exercise and diet rich and fruits and vegetables to reduce risk of heart attack and stroke. Exercise- encouraged.  Wt Readings from Last 3 Encounters:  09/27/23 220 lb 4 oz (99.9 kg)  05/10/23 211 lb 4 oz (95.8 kg)  03/13/23 222 lb 4 oz (100.8 kg)   3. Immunizations/screenings/ancillary studies Immunization History  Administered Date(s) Administered   Influenza-Unspecified 11/23/2016, 01/29/2019   PFIZER(Purple Top)SARS-COV-2 Vaccination 03/10/2020, 04/05/2020   Tdap 11/28/2017   There are no preventive care reminders to display for this patient.  4. Cervical cancer screening- utd 5. Breast cancer screening-  mammogram utd 6. Colon cancer screening - utd 7. Skin cancer screening- advised regular sunscreen use. Denies worrisome, changing, or new skin lesions.  8. Birth control/STD check-tl 9. Osteoporosis screening- n/a 10. Smoking associated screening - non smoker  There are no diagnoses linked to this encounter.  Recommended follow up: No follow-ups on file.  Lab/Order associations:+ fasting  Ellsworth Haas, MD

## 2023-09-29 ENCOUNTER — Ambulatory Visit: Payer: Self-pay | Admitting: Family Medicine

## 2023-09-29 DIAGNOSIS — D72829 Elevated white blood cell count, unspecified: Secondary | ICD-10-CM

## 2023-09-29 NOTE — Progress Notes (Signed)
 Wbc elevated.  She wasn't sick at appt-if still not sick, verify no steroids recently.  Repeat cbcd in 2 weeks Your cholesterol levels are elevated.  Work on low cholesterol and lower carbs/sugars diet and  get exercise to try to lower your cholesterol.  A1C(3 month average of sugars) is elevated.  This is considered PreDiabetes.  Work on diet-decrease sugars and starches and aim for 30 minutes of exercise 5 days/week to prevent progression to diabetes

## 2023-10-14 ENCOUNTER — Other Ambulatory Visit: Payer: Self-pay | Admitting: Family Medicine

## 2023-10-14 ENCOUNTER — Other Ambulatory Visit (INDEPENDENT_AMBULATORY_CARE_PROVIDER_SITE_OTHER)

## 2023-10-14 ENCOUNTER — Ambulatory Visit: Payer: Self-pay | Admitting: Family Medicine

## 2023-10-14 DIAGNOSIS — D72829 Elevated white blood cell count, unspecified: Secondary | ICD-10-CM

## 2023-10-14 DIAGNOSIS — Z1231 Encounter for screening mammogram for malignant neoplasm of breast: Secondary | ICD-10-CM

## 2023-10-14 LAB — CBC WITH DIFFERENTIAL/PLATELET
Basophils Absolute: 0 K/uL (ref 0.0–0.1)
Basophils Relative: 0.3 % (ref 0.0–3.0)
Eosinophils Absolute: 0.1 K/uL (ref 0.0–0.7)
Eosinophils Relative: 0.8 % (ref 0.0–5.0)
HCT: 38.8 % (ref 36.0–46.0)
Hemoglobin: 13.1 g/dL (ref 12.0–15.0)
Lymphocytes Relative: 41.4 % (ref 12.0–46.0)
Lymphs Abs: 3.2 K/uL (ref 0.7–4.0)
MCHC: 33.8 g/dL (ref 30.0–36.0)
MCV: 89.6 fl (ref 78.0–100.0)
Monocytes Absolute: 0.4 K/uL (ref 0.1–1.0)
Monocytes Relative: 4.6 % (ref 3.0–12.0)
Neutro Abs: 4.1 K/uL (ref 1.4–7.7)
Neutrophils Relative %: 52.9 % (ref 43.0–77.0)
Platelets: 190 K/uL (ref 150.0–400.0)
RBC: 4.33 Mil/uL (ref 3.87–5.11)
RDW: 13.3 % (ref 11.5–15.5)
WBC: 7.7 K/uL (ref 4.0–10.5)

## 2023-10-14 NOTE — Progress Notes (Signed)
 Wbc fine

## 2023-10-16 ENCOUNTER — Encounter

## 2023-10-16 DIAGNOSIS — Z1231 Encounter for screening mammogram for malignant neoplasm of breast: Secondary | ICD-10-CM

## 2023-11-05 ENCOUNTER — Ambulatory Visit
Admission: RE | Admit: 2023-11-05 | Discharge: 2023-11-05 | Disposition: A | Discharge status: Home / Self Care | Source: Ambulatory Visit | Attending: Family Medicine | Admitting: Family Medicine

## 2023-11-05 DIAGNOSIS — Z1231 Encounter for screening mammogram for malignant neoplasm of breast: Secondary | ICD-10-CM

## 2023-11-07 ENCOUNTER — Ambulatory Visit: Payer: Self-pay | Admitting: Family Medicine

## 2023-12-31 ENCOUNTER — Ambulatory Visit: Payer: Self-pay

## 2023-12-31 NOTE — Telephone Encounter (Signed)
 FYI Only or Action Required?: FYI only for provider.  Patient was last seen in primary care on 09/27/2023 by Wendolyn Jenkins Jansky, MD.  Called Nurse Triage reporting Knee Pain.  Symptoms began several months ago.  Interventions attempted: Nothing.  Symptoms are: gradually worsening.  Triage Disposition: See PCP Within 2 Weeks  Patient/caregiver understands and will follow disposition?: Yes                             Copied from CRM #[id number]. Topic: Clinical - Red Word Triage >> Dec 31, 2023  9:44 AM Turkey A wrote: [facility] that prompted transfer to Nurse Triage: Patient has pain in right knee- she hears crackling noise. Patient dislocated knee about year ago and continues to have issues. Reason for Disposition  Knee locking (i.e., joint gets stuck, catching) is a chronic symptom (recurrent or ongoing AND present > 4 weeks)  Answer Assessment - Initial Assessment Questions 1. LOCATION and RADIATION: Where is the pain located?      Localized to knee 2. QUALITY: What does the pain feel like?  (e.g., sharp, dull, aching, burning)     Denies pain, just hears and feels crackling sensation 3. SEVERITY: How bad is the pain? What does it keep you from doing?   (Scale 1-10; or mild, moderate, severe)     It's not pain yet, it's more so discomfort and a crackling noise 4. ONSET: When did the pain start? Does it come and go, or is it there all the time?     Sensations worsened at beginning of year  5. RECURRENT: Have you had this pain before? If Yes, ask: When, and what happened then?     States she dislocated right knee years ago 6. SETTING: Has there been any recent work, exercise or other activity that involved that part of the body?      Denies 7. AGGRAVATING FACTORS: What makes the knee pain worse? (e.g., walking, climbing stairs, running)     Climbing stairs 8. ASSOCIATED SYMPTOMS: Is there any swelling or redness of the knee?      Denies swelling, denies discoloration 9. OTHER SYMPTOMS: Do you have any other symptoms? (e.g., calf pain, chest pain, difficulty breathing, fever)     Denies chest pain, denies difficult breathing, denies fever  Protocols used: Knee Pain-A-AH

## 2023-12-31 NOTE — Telephone Encounter (Signed)
 Scheduled for tomorrow.

## 2024-01-01 ENCOUNTER — Ambulatory Visit (INDEPENDENT_AMBULATORY_CARE_PROVIDER_SITE_OTHER): Admitting: Family Medicine

## 2024-01-01 ENCOUNTER — Ambulatory Visit (INDEPENDENT_AMBULATORY_CARE_PROVIDER_SITE_OTHER)

## 2024-01-01 ENCOUNTER — Encounter: Payer: Self-pay | Admitting: Family Medicine

## 2024-01-01 VITALS — BP 118/84 | HR 76 | Temp 97.9°F | Resp 18 | Ht 67.0 in | Wt 218.1 lb

## 2024-01-01 DIAGNOSIS — M25561 Pain in right knee: Secondary | ICD-10-CM

## 2024-01-01 DIAGNOSIS — G8929 Other chronic pain: Secondary | ICD-10-CM

## 2024-01-01 DIAGNOSIS — M25562 Pain in left knee: Secondary | ICD-10-CM

## 2024-01-01 NOTE — Progress Notes (Signed)
 Subjective:     Patient ID: Natasha Murphy, female    DOB: 1971/01/29, 53 y.o.   MRN: [id number]  Chief Complaint  Patient presents with   Knee Issue    Right knee crackling noise getting worse Would like both knees x-rayed    HPI Discussed the use of AI scribe software for clinical note transcription with the patient, who gave verbal consent to proceed.  History of Present Illness Natasha Murphy is a 53 year old female who presents with worsening knee symptoms.  She experiences worsening symptoms in her knees, including increased crackling and slight pain. There is weakness, particularly when using stairs, and instability, especially when carrying her grandchild. Initially, the symptoms were more pronounced in the right knee, but she now experiences similar issues in the left knee. No catching sensation in the knees.  She takes vitamin C and D sporadically, depending on her daily routine. She has not been taking Tylenol or ibuprofen regularly, reserving them for particularly bad days. She and her husband have purchased turmeric but has not yet started taking it consistently. She is also considering collagen and magnesium supplements     Health Maintenance Due  Topic Date Due   Hepatitis B Vaccines 19-59 Average Risk (1 of 3 - 19+ 3-dose series) Never done    Past Medical History:  Diagnosis Date   Depression 2015   GERD (gastroesophageal reflux disease) 2020   Heart murmur 1995   HSV-2 seropositive    Hx of chlamydia infection 1995, 01/2009   Hx of gonorrhea 1995   Lactose intolerance    Varicose veins    Vitamin D  deficiency     Past Surgical History:  Procedure Laterality Date   CESAREAN SECTION WITH BILATERAL TUBAL LIGATION     UMBILICAL HERNIA REPAIR  age 76     Current Outpatient Medications:    acetaminophen (TYLENOL) 500 MG tablet, Take 500-1,000 mg by mouth every 6 (six) hours as needed for moderate pain., Disp: , Rfl:    ASHWAGANDHA PO,  Take 120 mg by mouth daily., Disp: , Rfl:    cetirizine (ZYRTEC ALLERGY) 10 MG tablet, Take 10 mg by mouth daily as needed for allergies. , Disp: , Rfl:    ibuprofen (ADVIL) 600 MG tablet, ibuprofen 600 mg tablet  TAKE 1 TABLET BY MOUTH EVERY 6 TO 8 HOURS AS NEEDED, Disp: , Rfl:    Multiple Vitamin (MULTIVITAMIN ADULT PO), Take by mouth., Disp: , Rfl:    Ascorbic Acid (VITAMIN C) 500 MG CAPS, See admin instructions. (Patient not taking: Reported on 01/01/2024), Disp: , Rfl:    Cholecalciferol  (VITAMIN D ) 2000 units CAPS, Take 1 capsule by mouth daily. (Patient not taking: Reported on 01/01/2024), Disp: , Rfl:   Allergies  Allergen Reactions   Tramadol Palpitations and Shortness Of Breath    Other reaction(s): tachycardia   Penicillin V Other (See Comments)   Penicillins Hives and Rash    Other reaction(s): rash   ROS neg/noncontributory except as noted HPI/below      Objective:     BP 118/84   Pulse 76   Temp 97.9 F (36.6 C) (Temporal)   Resp 18   Ht 5' 7 (1.702 m)   Wt 218 lb 2 oz (98.9 kg)   LMP 07/12/2021 (Approximate)   SpO2 98%   BMI 34.16 kg/m  Wt Readings from Last 3 Encounters:  01/01/24 218 lb 2 oz (98.9 kg)  09/27/23 220 lb 4 oz (99.9 kg)  05/10/23 211 lb 4 oz (95.8 kg)    Physical Exam   Gen: WDWN NAD HEENT: NCAT, conjunctiva not injected, sclera nonicteric EXT:  no edema MSK: no gross abnormalities.  NEURO: A&O x3.  CN II-XII intact.  PSYCH: normal mood. Good eye contact  Knee exam: Bilat No deformity on inspection. No pain with palpation of knee landmarks. No effusion/swelling noted. FROM in flex/extension with crepitus. No popliteal fullness. Neg drawer test. Neg mcmurray test. No pain with valgus/varus stress. No PFgrind. No abnormal patellar mobility.      Assessment & Plan:  Chronic pain of both knees -     DG Knee 1-2 Views Left; Future -     DG Knee 1-2 Views Right; Future -     Ambulatory referral to Physical Therapy  Assessment  and Plan Assessment & Plan Chronic knee pain and weakness   She experiences chronic knee pain and weakness, with worsening crackling and slight pain, especially when using stairs or carrying extra weight. Symptoms are bilateral, with recent onset in the left knee. There is no knee catching. Potential causes include arthritis or mild meniscal tears. X-rays may not correlate with symptom severity, and MRI is not indicated at this time. Order a knee x-ray to assess structural changes. Refer to physical therapy to strengthen knee muscles and improve stability. Advise on using turmeric, collagen, and magnesium supplements as supportive measures. Discuss potential future interventions, including sports medicine or orthopedic consultation for injections if symptoms worsen.    Return if symptoms worsen or fail to improve.  [name], MD

## 2024-01-01 NOTE — Patient Instructions (Addendum)
 It was very nice to see you today!  Tumeric Collagen Magnesium    PLEASE NOTE:  If you had any lab tests please let us  know if you have not heard back within a few days. You may see your results on MyChart before we have a chance to review them but we will give you a call once they are reviewed by us . If we ordered any referrals today, please let us  know if you have not heard from their office within the next week.   Please try these tips to maintain a healthy lifestyle:  Eat most of your calories during the day when you are active. Eliminate processed foods including packaged sweets (pies, cakes, cookies), reduce intake of potatoes, white bread, white pasta, and white rice. Look for whole grain options, oat flour or almond flour.  Each meal should contain half fruits/vegetables, one quarter protein, and one quarter carbs (no bigger than a computer mouse).  Cut down on sweet beverages. This includes juice, soda, and sweet tea. Also watch fruit intake, though this is a healthier sweet option, it still contains natural sugar! Limit to 3 servings daily.  Drink at least 1 glass of water with each meal and aim for at least 8 glasses per day  Exercise at least 150 minutes every week.

## 2024-01-06 ENCOUNTER — Telehealth: Payer: Self-pay | Admitting: Radiology

## 2024-01-06 ENCOUNTER — Ambulatory Visit: Payer: Self-pay | Admitting: Family Medicine

## 2024-01-06 NOTE — Progress Notes (Signed)
 Xray negative

## 2024-01-06 NOTE — Telephone Encounter (Signed)
-----   Message from [name] sent at 01/06/2024 12:58 PM EDT ----- X-ray negative.  ----- Message ----- From: Rebecka Mound Results In Sent: 01/06/2024  10:15 AM EDT To: [name], MD

## 2024-01-06 NOTE — Telephone Encounter (Signed)
 Knee Bilateral marked for review today.

## 2024-01-06 NOTE — Telephone Encounter (Signed)
 Spoke with patient about recent Xray results. Pt acknowledged understanding.

## 2024-01-15 ENCOUNTER — Ambulatory Visit: Admitting: Physical Therapy

## 2024-10-02 ENCOUNTER — Encounter: Admitting: Family Medicine
